# Patient Record
Sex: Male | Born: 1985 | Race: Black or African American | Hispanic: No | Marital: Single
Health system: Midwestern US, Community
[De-identification: ages and names within clinical notes are randomized; demographics above are authoritative.]

## PROBLEM LIST (undated history)

## (undated) HISTORY — DX: Morbid (severe) obesity due to excess calories: E66.01

## (undated) HISTORY — PX: NO PAST SURGERIES: SHX2092

---

## 2015-02-09 ENCOUNTER — Ambulatory Visit
Admit: 2015-02-09 | Discharge: 2015-02-09 | Payer: BLUE CROSS/BLUE SHIELD | Attending: Family Medicine | Primary: Family Medicine

## 2015-02-09 ENCOUNTER — Ambulatory Visit: Attending: Family Medicine | Primary: Family Medicine

## 2015-02-09 DIAGNOSIS — Z0001 Encounter for general adult medical examination with abnormal findings: Secondary | ICD-10-CM

## 2015-02-09 DIAGNOSIS — Z6841 Body Mass Index (BMI) 40.0 and over, adult: Secondary | ICD-10-CM | POA: Insufficient documentation

## 2015-02-09 NOTE — Patient Instructions (Signed)
Assessment:  1. Encounter for general adult medical examination with abnormal findings    2. Pes planus of both feet    3. Dyslipidemia    4. BMI 40.0-44.9, adult [facility](HCC)      Preventive exam    Plan:   \\Patient will have separately identifiable exam note to discuss problems noted today during this physical exam so that we can address and allot the appropriate amount of time to consult on the medical problems evident if necessary based on lab results.  Discussed the patient's above normal BMI with him.  I have recommended the following interventions: dietary management education, guidance, and counseling .  The BMI follow up plan is as follows: see below   Specifics about exercise and weight loss are important.  Try and get 45 minutes to 60 minutes per day of exercise for weight loss to occur.  This doesn't all have to be at once, it can be broken up throughout the day and can be cumulative time! Also, try to really get 5 days a week of exercise, but 3 days minimum.   Other useful tips are to park far away from store entrances so that you have to walk further.  Take the stairs if 3 flights or less instead of the elevator.  Focus on staying on the outer aisles at the grocery store, and avoid the inside aisles.  Also, try not to buy things with a plastic package surrounding them, as that is often a sign that it probably isn't good for you (potatio chips, crackers, or andy sort, cereal candy etc.)  Remember if it is raw it is probably great for you- raw vegetables and fruit and grains that is.  OUr dietician will re-enforce this and more if you select her services.    i will set up PT for custom shoe inserts and advice about your shin splints and flat feet.     No orders of the defined types were placed in this encounter.    Follow-up Disposition:  Return we will call when labs are back.

## 2015-02-09 NOTE — Progress Notes (Signed)
Subjective:  Peter CharlesWillie D Baird is a 29 y.o. male presents today with medical problems and to obtain refills if necessary, and they are also meeting me as their new physician for the first time as their initial visit   for a preventive wellness exam.   See chart for updated SH, FH, PSH, PMH, CURRENT MEDICATIONS AND HABITS, and complete systems review.    Objective:  Blood pressure 134/76, pulse 70, height 6\' 1"  (1.854 m), weight 318 lb (144.2 kg).  Body mass index is 41.96 kg/(m^2).   General- pleasant, no distress  Psych- alert and oriented to person, place and time  Mood and affect are appropriate to the visit  Lymph/Neck: normal  HEENT: normal  LUNGS: bcta  HEART: rrr s mrg  ABD: wnl  GENT: wnl.  BONE/JOINT: normal  NEURO: normal  SKIN: normal    Assessment:  1. Encounter for general adult medical examination with abnormal findings    2. Pes planus of both feet    3. Dyslipidemia    4. BMI 40.0-44.9, adult [facility](HCC)      Preventive exam    Plan:   \\Patient will have separately identifiable exam note to discuss problems noted today during this physical exam so that we can address and allot the appropriate amount of time to consult on the medical problems evident if necessary based on lab results.  Discussed the patient's above normal BMI with him.  I have recommended the following interventions: dietary management education, guidance, and counseling .  The BMI follow up plan is as follows: see below   Specifics about exercise and weight loss are important.  Try and get 45 minutes to 60 minutes per day of exercise for weight loss to occur.  This doesn't all have to be at once, it can be broken up throughout the day and can be cumulative time! Also, try to really get 5 days a week of exercise, but 3 days minimum.   Other useful tips are to park far away from store entrances so that you have to walk further.  Take the stairs if 3 flights or less instead of the  elevator.  Focus on staying on the outer aisles at the grocery store, and avoid the inside aisles.  Also, try not to buy things with a plastic package surrounding them, as that is often a sign that it probably isn't good for you (potatio chips, crackers, or andy sort, cereal candy etc.)  Remember if it is raw it is probably great for you- raw vegetables and fruit and grains that is.  OUr dietician will re-enforce this and more if you select her services.    i will set up PT for custom shoe inserts and advice about your shin splints and flat feet.     Orders Placed This Encounter   ??? METABOLIC PANEL, COMPREHENSIVE   ??? CBC WITH AUTOMATED DIFF   ??? TSH 3RD GENERATION   ??? HEMOGLOBIN A1C W/O EAG   ??? LIPID CASCADE W/REFLEX TO LIPO (WITH GRAPH)   ??? REFERRAL TO PHYSICAL THERAPY     Follow-up Disposition:  Return we will call when labs are back.

## 2015-02-10 LAB — CBC WITH AUTOMATED DIFF
ABS. BASOPHILS: 0 10*3/uL (ref 0.0–0.2)
ABS. EOSINOPHILS: 0.3 10*3/uL (ref 0.0–0.4)
ABS. IMM. GRANS.: 0 10*3/uL (ref 0.0–0.1)
ABS. MONOCYTES: 0.4 10*3/uL (ref 0.1–0.9)
ABS. NEUTROPHILS: 1.8 10*3/uL (ref 1.4–7.0)
Abs Lymphocytes: 1.7 10*3/uL (ref 0.7–3.1)
BASOPHILS: 1 %
EOSINOPHILS: 6 %
HCT: 42.6 % (ref 37.5–51.0)
HGB: 13.8 g/dL (ref 12.6–17.7)
IMMATURE GRANULOCYTES: 0 %
Lymphocytes: 41 %
MCH: 26.9 pg (ref 26.6–33.0)
MCHC: 32.4 g/dL (ref 31.5–35.7)
MCV: 83 fL (ref 79–97)
MONOCYTES: 10 %
NEUTROPHILS: 42 %
PLATELET: 244 10*3/uL (ref 150–379)
RBC: 5.13 x10E6/uL (ref 4.14–5.80)
RDW: 14.6 % (ref 12.3–15.4)
WBC: 4.2 10*3/uL (ref 3.4–10.8)

## 2015-02-10 LAB — LIPOPROTEIN ANALYSIS, BY NMR
HDL-P (Total): 31 umol/L (ref >=30.5)
LDL size: 21.6 nm (ref >20.5)
LDL-P: 1016 nmol/L — ABNORMAL HIGH (ref <1000)
LP-IR SCORE: 30 (ref <=45)
NMR PDF Image: 0
Small LDL-P: 278 nmol/L (ref <=527)

## 2015-02-10 LAB — METABOLIC PANEL, COMPREHENSIVE
A-G Ratio: 1.8 (ref 1.1–2.5)
ALT (SGPT): 22 IU/L (ref 0–44)
AST (SGOT): 20 IU/L (ref 0–40)
Albumin: 4.8 g/dL (ref 3.5–5.5)
Alk. phosphatase: 65 IU/L (ref 39–117)
BUN/Creatinine ratio: 12 (ref 8–19)
BUN: 12 mg/dL (ref 6–20)
Bilirubin, total: 0.2 mg/dL (ref 0.0–1.2)
CO2: 25 mmol/L (ref 18–29)
Calcium: 9.4 mg/dL (ref 8.7–10.2)
Chloride: 102 mmol/L (ref 97–106)
Creatinine: 0.97 mg/dL (ref 0.76–1.27)
GFR est AA: 123 mL/min/{1.73_m2} (ref >59)
GFR est non-AA: 107 mL/min/{1.73_m2} (ref >59)
GLOBULIN, TOTAL: 2.7 g/dL (ref 1.5–4.5)
Glucose: 81 mg/dL (ref 65–99)
Potassium: 4.5 mmol/L (ref 3.5–5.2)
Protein, total: 7.5 g/dL (ref 6.0–8.5)
Sodium: 142 mmol/L (ref 136–144)

## 2015-02-10 LAB — LIPID CASCADE W/REFLEX TO LIPO (WITH GRAPH)
Cholesterol, total: 164 mg/dL (ref 100–199)
HDL Cholesterol: 45 mg/dL (ref >39)
LDL, calculated: 98 mg/dL (ref 0–99)
LDL/HDL Ratio: 2.2 ratio units (ref 0.0–3.6)
Non-HDL Cholesterol: 119 mg/dL (ref 0–129)
Triglyceride: 105 mg/dL (ref 0–149)

## 2015-02-10 LAB — HEMOGLOBIN A1C W/O EAG: Hemoglobin A1c: 5.7 % — ABNORMAL HIGH (ref 4.8–5.6)

## 2015-02-10 LAB — TSH 3RD GENERATION: TSH: 1.01 u[IU]/mL (ref 0.450–4.500)

## 2015-02-17 NOTE — Progress Notes (Signed)
LMOM 11.16.16 for pt to c/b and schedule appt to discuss labs.  Labs in black box

## 2015-02-18 ENCOUNTER — Encounter: Attending: Family Medicine | Primary: Family Medicine

## 2015-02-18 NOTE — Progress Notes (Signed)
Labs printed, appt for 11.17.16 was cxd. Pt will c/b to reschedule. Labs in black box.

## 2016-12-26 ENCOUNTER — Ambulatory Visit
Admission: EM | Admit: 2016-12-26 | Discharge: 2016-12-26 | Disposition: A | Discharge status: Home / Self Care | Payer: 59 | Attending: Emergency Medicine | Admitting: Emergency Medicine

## 2016-12-26 ENCOUNTER — Encounter: Payer: Self-pay | Admitting: Emergency Medicine

## 2016-12-26 DIAGNOSIS — S91219A Laceration without foreign body of unspecified toe(s) with damage to nail, initial encounter: Secondary | ICD-10-CM

## 2016-12-26 DIAGNOSIS — Z23 Encounter for immunization: Secondary | ICD-10-CM | POA: Diagnosis not present

## 2016-12-26 DIAGNOSIS — S91212A Laceration without foreign body of left great toe with damage to nail, initial encounter: Secondary | ICD-10-CM | POA: Diagnosis not present

## 2016-12-26 MED ORDER — MUPIROCIN 2 % EX OINT: 1.0000 "application " | TOPICAL_OINTMENT | Freq: Three times a day (TID) | CUTANEOUS | 0 refills | Status: DC

## 2016-12-26 MED ORDER — TETANUS-DIPHTH-ACELL PERTUSSIS 5-2.5-18.5 LF-MCG/0.5 IM SUSP
0.5000 mL | Freq: Once | INTRAMUSCULAR | Status: AC
  Administered 2016-12-26: 0.5 mL via INTRAMUSCULAR

## 2016-12-26 NOTE — ED Triage Notes (Signed)
Patient states that he cut his left 1st toe a week ago.  Patient states that he has had some swelling and discoloration in his toenail.

## 2016-12-26 NOTE — ED Provider Notes (Signed)
[facility]    CSN: [id number] Arrival date & time: 12/26/16  1827     History   Chief Complaint Chief Complaint  Patient presents with  . Extremity Laceration    HPI Kamar Callender is a 31 y.o. male.   HPI  This a 31 year old male who presents with a laceration over the base of his left great toenail that occurred about a week ago. He states that in his work boots he thinks that the steel toe became uncovered and actually lacerated the base of the toe nail. It swelled at first and was painful but with elevation it seemed to subside. He's been applying  Neosporin ointment to the area. They came in to see if anything else needs to be done. Has had no drainage from the area . He's had no swelling and no redness. He is not current on his tetanus toxoid.         History reviewed. No pertinent past medical history.  There are no active problems to display for this patient.   History reviewed. No pertinent surgical history.     Home Medications    Prior to Admission medications   Medication Sig Start Date End Date Taking? Authorizing Provider  mupirocin ointment (BACTROBAN) 2 % Apply 1 application topically 3 (three) times daily. 12/26/16   Lutricia Feil, PA-C    Family History Family History  Problem Relation Age of Onset  . Hypertension Mother   . Cancer Father     Social History Social History  Substance Use Topics  . Smoking status: Never Smoker  . Smokeless tobacco: Never Used  . Alcohol use No     Allergies   Patient has no known allergies.   Review of Systems Review of Systems  Constitutional: Positive for activity change. Negative for appetite change, chills, fatigue and fever.  Skin: Positive for color change and wound.  All other systems reviewed and are negative.    Physical Exam Triage Vital Signs ED Triage Vitals  Enc Vitals Group     BP 12/26/16 1911 135/66     Pulse Rate 12/26/16 1911 65     Resp 12/26/16 1911 17     Temp 12/26/16 1911 98.3 F (36.8 C)     Temp Source 12/26/16 1911 Oral     SpO2 12/26/16 1911 99 %     Weight 12/26/16 1908 (!) 310 lb (140.6 kg)     Height 12/26/16 1908  (1.854 m)     Head Circumference --      Peak Flow --      Pain Score 12/26/16 1908 0     Pain Loc --      Pain Edu? --      Excl. in GC? --    No data found.   Updated Vital Signs BP 135/66 (BP Location: Left Arm)   Pulse 65   Temp 98.3 F (36.8 C) (Oral)   Resp 17   Ht  (1.854 m)   Wt (!) 310 lb (140.6 kg)   SpO2 99%   BMI 40.90 kg/m   Visual Acuity Right Eye Distance:   Left Eye Distance:   Bilateral Distance:    Right Eye Near:   Left Eye Near:    Bilateral Near:     Physical Exam  Constitutional: He is oriented to person, place, and time. He appears well-developed and well-nourished. No distress.  HENT:  Head: Normocephalic.  Eyes: Pupils are equal, round, and reactive  to light.  Neck: Normal range of motion.  Musculoskeletal: Normal range of motion. He exhibits no edema, tenderness or deformity.  Examination of the left great toe shows a mild separation of the base of the nail from the matrix. There is a partial avulsion. There is no swelling no warmth drainage or tenderness present. He has no subungual hematoma.  Neurological: He is alert and oriented to person, place, and time.  Skin: Skin is warm and dry. He is not diaphoretic.  Psychiatric: He has a normal mood and affect. His behavior is normal. Judgment and thought content normal.  Nursing note and vitals reviewed.    UC Treatments / Results  Labs (all labs ordered are listed, but only abnormal results are displayed) Labs Reviewed - No data to display  EKG  EKG Interpretation None       Radiology No results found.  Procedures Procedures (including critical care time)  Medications Ordered in UC Medications  Tdap (BOOSTRIX) injection 0.5 mL (0.5 mLs Intramuscular Given 12/26/16 1914)     Initial  Impression / Assessment and Plan / UC Course  I have reviewed the triage vital signs and the nursing notes.  Pertinent labs & imaging results that were available during my care of the patient were reviewed by me and considered in my medical decision making (see chart for details).     Plan: 1. Test/x-ray results and diagnosis reviewed with patient 2. rx as per orders; risks, benefits, potential side effects reviewed with patient 3. Recommend supportive treatment with the use of Bactroban ointment at the base of the nail. Using a dry sterile dressing. Told him there is a likelihood that he may lose the nail will regrow back in all probability. If there is any worsening symptoms he should follow-up with podiatrist. I've given him the name and number of Dr. Ether Griffins. 4. F/u prn if symptoms worsen or don't improve   Final Clinical Impressions(s) / UC Diagnoses   Final diagnoses:  Laceration of nail bed of toe, initial encounter    New Prescriptions Discharge Medication List as of 12/26/2016  7:42 PM    START taking these medications   Details  mupirocin ointment (BACTROBAN) 2 % Apply 1 application topically 3 (three) times daily., Starting Tue 12/26/2016, Normal         Controlled Substance Prescriptions [place] Controlled Substance Registry consulted? Not Applicable   Lutricia Feil, PA-C 12/26/16 1950

## 2017-06-14 ENCOUNTER — Encounter: Payer: Self-pay | Admitting: Nurse Practitioner

## 2017-06-14 ENCOUNTER — Ambulatory Visit (INDEPENDENT_AMBULATORY_CARE_PROVIDER_SITE_OTHER): Payer: 59 | Admitting: Nurse Practitioner

## 2017-06-14 VITALS — BP 134/69 | HR 64 | Temp 98.8°F | Resp 16 | Ht 73.0 in | Wt 313.0 lb

## 2017-06-14 DIAGNOSIS — Z7689 Persons encountering health services in other specified circumstances: Secondary | ICD-10-CM | POA: Diagnosis not present

## 2017-06-14 DIAGNOSIS — R0789 Other chest pain: Secondary | ICD-10-CM | POA: Diagnosis not present

## 2017-06-14 NOTE — Patient Instructions (Addendum)
Jamie Ortiz, Thank you for coming in to clinic today.  1. Continue to monitor chest pain symptoms   Work to identify any possible triggers for the symptoms. Lots of things can cause these symptoms.  If they return, we can always consider EKG or other workup when the pain is happening.  Please schedule a follow-up appointment with Jamie Ortiz, AGNP. Return in about 4 weeks (around 07/12/2017) for annual physical.  If you have any other questions or concerns, please feel free to call the clinic or send a message through MyChart. You may also schedule an earlier appointment if necessary.  You will receive a survey after today's visit either digitally by e-mail or paper by [employer] mail. Your experiences and feedback matter to us.  Please respond so we know how we are doing as we provide care for you.   Jamie McardleLauren Grey Rakestraw, DNP, AGNP-BC Adult Gerontology Nurse Practitioner [facility]  Phone: [phone] Hours: 8a-4p

## 2017-06-14 NOTE — Progress Notes (Signed)
Subjective:    Patient ID: Jamie Ortiz, male    DOB: 1985-12-26, 32 y.o.   MRN: [id number]  Jamie Ortiz is a 32 y.o. male presenting on 06/14/2017 for Establish Care   HPI Establish Care New Provider Pt last seen by PCP 2017 - in [place].  Obtain records from Dr. Lance Coon.   Intermittent Chest pains Occasionally has chest pain, and some in area of his heart.  2006-2007 had chest pains and had no results.  Wore a holter monitor.  Reduce sugar intake was only recommendation at that time. - Had heart murmur which resolved. - Periods of several months without symptoms and then will have 2-3 episodes and it will resolve. - Pain not changed with activity.   - No association to anxiety /stress - Last episode was about 2 weeks ago and none since that time. - No worsening or change since 07-06  History reviewed. No pertinent past medical history. History reviewed. No pertinent surgical history. Social History   Socioeconomic History  . Marital status: Single    Spouse name: Not on file  . Number of children: Not on file  . Years of education: Not on file  . Highest education level: High school graduate  Occupational History  . Not on file  Social Needs  . Financial resource strain: Not on file  . Food insecurity:    Worry: Not on file    Inability: Not on file  . Transportation needs:    Medical: Not on file    Non-medical: Not on file  Tobacco Use  . Smoking status: Never Smoker  . Smokeless tobacco: Never Used  Substance and Sexual Activity  . Alcohol use: No  . Drug use: No  . Sexual activity: Yes    Birth control/protection: None  Lifestyle  . Physical activity:    Days per week: Not on file    Minutes per session: Not on file  . Stress: Not on file  Relationships  . Social connections:    Talks on phone: Not on file    Gets together: Not on file    Attends religious service: Not on file    Active member of club or organization: Not on file    Attends  meetings of clubs or organizations: Not on file    Relationship status: Not on file  . Intimate partner violence:    Fear of current or ex partner: Not on file    Emotionally abused: Not on file    Physically abused: Not on file    Forced sexual activity: Not on file  Other Topics Concern  . Not on file  Social History Narrative  . Not on file   Family History  Problem Relation Age of Onset  . Hypertension Mother   . Cancer Father        lung, prostate  . Healthy Sister    Current Outpatient Medications on File Prior to Visit  Medication Sig  . mupirocin ointment (BACTROBAN) 2 % Apply 1 application topically 3 (three) times daily. (Patient not taking: Reported on 06/14/2017)   No current facility-administered medications on file prior to visit.     Review of Systems  Constitutional: Negative.   HENT: Negative.   Eyes: Negative.   Respiratory: Positive for chest tightness.   Cardiovascular: Negative.   Gastrointestinal: Negative.   Endocrine: Negative.   Genitourinary: Negative.   Musculoskeletal: Negative.   Skin: Negative.   Allergic/Immunologic: Negative.   Neurological: Negative.  Hematological: Negative.   Psychiatric/Behavioral: Negative.    Per HPI unless specifically indicated above     Objective:    BP 134/69   Pulse 64   Temp 98.8 F (37.1 C) (Oral)   Resp 16   Ht 6\' 1"  (1.854 m)   Wt (!) 313 lb (142 kg)   BMI 41.30 kg/m   Wt Readings from Last 3 Encounters:  06/14/17 (!) 313 lb (142 kg)  12/26/16 (!) 310 lb (140.6 kg)    Physical Exam  Constitutional: He is oriented to person, place, and time. He appears well-developed and well-nourished. No distress.  HENT:  Head: Normocephalic and atraumatic.  Right Ear: External ear normal.  Left Ear: External ear normal.  Nose: Nose normal.  Mouth/Throat: Oropharynx is clear and moist.  Eyes: Pupils are equal, round, and reactive to light. Conjunctivae are normal.  Neck: Normal range of motion. Neck  supple. No JVD present. No tracheal deviation present. No thyromegaly present.  Cardiovascular: Normal rate, regular rhythm, normal heart sounds and intact distal pulses. Exam reveals no gallop and no friction rub.  No murmur heard. Pulmonary/Chest: Effort normal and breath sounds normal. No respiratory distress.  Abdominal: Soft. Bowel sounds are normal. He exhibits no distension. There is no tenderness.  Musculoskeletal: Normal range of motion.  No reproducible chest wall tenderness  Lymphadenopathy:    He has no cervical adenopathy.  Neurological: He is alert and oriented to person, place, and time. No cranial nerve deficit.  Skin: Skin is warm and dry.  Psychiatric: He has a normal mood and affect. His behavior is normal. Judgment and thought content normal.  Nursing note and vitals reviewed.    No results found for this or any previous visit.    Assessment & Plan:   Problem List Items Addressed This Visit    None    Visit Diagnoses    Encounter to establish care    -  Primary Previous PCP many years ago.  Records will not be requested.  Past medical, family, and surgical history reviewed w/ pt. patient desires annual physical.  Return 4 weeks for annual physical.     Chest wall discomfort Intermittent episodes of chest wall discomfort.  Prior workup for cardiac cause was negative in 2006-2007.  Current symptoms recurred 2 weeks ago were very similar to symptoms at that time.  No recurrence in the last 2 weeks.  Differential diagnosis includes: Anxiety with panic attack, chest wall muscle strain, SVT, MI, PE.  Unlikely that patient experienced MI or PE given negative symptoms over the last 2 weeks.  Plan: 1.  Monitor symptoms and return sooner if symptoms recur.  Consider EKG at that time.  Patient agrees to defer EKG today. 2.  Follow-up with recurrence of symptoms.  Labs deferred to annual physical by patient today.       Follow up plan: Return in about 4 weeks (around  07/12/2017) for annual physical.  Wilhelmina McardleLauren Vidal Lampkins, DNP, AGPCNP-BC Adult Gerontology Primary Care Nurse Practitioner [facility] 06/30/2017, 4:16 PM

## 2017-06-30 ENCOUNTER — Encounter: Payer: Self-pay | Admitting: Nurse Practitioner

## 2017-07-17 ENCOUNTER — Other Ambulatory Visit: Payer: Self-pay

## 2017-07-17 ENCOUNTER — Encounter: Payer: Self-pay | Admitting: Nurse Practitioner

## 2017-07-17 ENCOUNTER — Ambulatory Visit (INDEPENDENT_AMBULATORY_CARE_PROVIDER_SITE_OTHER): Payer: 59 | Admitting: Nurse Practitioner

## 2017-07-17 VITALS — BP 127/58 | HR 73 | Temp 98.4°F | Ht 73.0 in | Wt 318.0 lb

## 2017-07-17 DIAGNOSIS — Z0000 Encounter for general adult medical examination without abnormal findings: Secondary | ICD-10-CM | POA: Diagnosis not present

## 2017-07-17 NOTE — Progress Notes (Signed)
Subjective:    Patient ID: Jamie Ortiz, male    DOB: 18-Mar-1986, 32 y.o.   MRN: [id number]  Roby Donaway is a 32 y.o. male presenting on 07/17/2017 for Annual Exam and Sinus Problem (post nasal drainage x 2 hrs )   HPI Annual Physical Exam Patient has been feeling well.  They have acute concerns today about sinus congestion and low back pain. Sleeps 5-6, occasionally 7 hours per night uninterrupted. - Back pain started about 1.5 weeks ago.  Not sure what cause was or if it was related to stomach sleeping.  He is transitioning away from stomach sleeping to improve symptoms. Very minimal improvement with ibuprofen qod.  Is doing some lower back stretches with temporary relief.   HEALTH MAINTENANCE: Weight/BMI: increased 5 lbs in 4 weeks Physical activity: Resumed exercise routine 4-5 days. Diet: Is reducing meals out. Seatbelt: always Sunscreen: usually  HIV: declines Optometry: about 3 years ago Dentistry: every 6 months  VACCINES: Tetanus: 12/2016   Sinus congestion Pt has noted post nasal drip x last 2 hours.  Is not yet taking allergy medication and is planning to start today.  No additional concerns regarding this for today's visit.  No past medical history on file. No past surgical history on file. Social History   Socioeconomic History  . Marital status: Single    Spouse name: Not on file  . Number of children: Not on file  . Years of education: Not on file  . Highest education level: High school graduate  Occupational History  . Not on file  Social Needs  . Financial resource strain: Not on file  . Food insecurity:    Worry: Not on file    Inability: Not on file  . Transportation needs:    Medical: Not on file    Non-medical: Not on file  Tobacco Use  . Smoking status: Never Smoker  . Smokeless tobacco: Never Used  Substance and Sexual Activity  . Alcohol use: No  . Drug use: No  . Sexual activity: Yes    Birth control/protection: None  Lifestyle  .  Physical activity:    Days per week: Not on file    Minutes per session: Not on file  . Stress: Not on file  Relationships  . Social connections:    Talks on phone: Not on file    Gets together: Not on file    Attends religious service: Not on file    Active member of club or organization: Not on file    Attends meetings of clubs or organizations: Not on file    Relationship status: Not on file  . Intimate partner violence:    Fear of current or ex partner: Not on file    Emotionally abused: Not on file    Physically abused: Not on file    Forced sexual activity: Not on file  Other Topics Concern  . Not on file  Social History Narrative  . Not on file   Family History  Problem Relation Age of Onset  . Hypertension Mother   . Cancer Father        lung, prostate  . Healthy Sister    Current Outpatient Medications on File Prior to Visit  Medication Sig  . mupirocin ointment (BACTROBAN) 2 % Apply 1 application topically 3 (three) times daily. (Patient not taking: Reported on 06/14/2017)   No current facility-administered medications on file prior to visit.     Review of Systems  Constitutional: Negative.  HENT: Positive for congestion and sinus pressure.   Eyes: Negative.   Respiratory: Negative.   Cardiovascular: Negative.   Gastrointestinal: Negative.   Endocrine: Negative.   Genitourinary: Negative.   Musculoskeletal: Positive for arthralgias and back pain.  Skin: Negative.   Allergic/Immunologic: Negative.   Neurological: Negative.   Hematological: Negative.   Psychiatric/Behavioral: Negative.    Per HPI unless specifically indicated above     Objective:    BP (!) 127/58 (BP Location: Right Arm, Patient Position: Sitting, Cuff Size: Large)   Pulse 73   Temp 98.4 F (36.9 C) (Oral)   Ht 6' 1"  (1.854 m)   Wt (!) 318 lb (144.2 kg)   BMI 41.96 kg/m   Wt Readings from Last 3 Encounters:  07/17/17 (!) 318 lb (144.2 kg)  06/14/17 (!) 313 lb (142 kg)    12/26/16 (!) 310 lb (140.6 kg)    Physical Exam  Constitutional: He is oriented to person, place, and time. He appears well-developed and well-nourished. No distress.  HENT:  Head: Normocephalic and atraumatic.  Right Ear: External ear normal.  Left Ear: External ear normal.  Nose: Nose normal.  Mouth/Throat: Oropharynx is clear and moist.  Eyes: Pupils are equal, round, and reactive to light. Conjunctivae are normal.  Neck: Normal range of motion. Neck supple. No JVD present. No tracheal deviation present. No thyromegaly present.  Cardiovascular: Normal rate, regular rhythm, normal heart sounds and intact distal pulses. Exam reveals no gallop and no friction rub.  No murmur heard. Pulmonary/Chest: Effort normal and breath sounds normal. No respiratory distress.  Abdominal: Soft. Bowel sounds are normal. He exhibits no distension. There is no tenderness.  Musculoskeletal: Normal range of motion.  Lymphadenopathy:    He has no cervical adenopathy.  Neurological: He is alert and oriented to person, place, and time. No cranial nerve deficit.  Skin: Skin is warm and dry.  Psychiatric: He has a normal mood and affect. His behavior is normal. Judgment and thought content normal.  Nursing note and vitals reviewed.      Assessment & Plan:   Problem List Items Addressed This Visit    None    Visit Diagnoses    Encounter for annual physical exam    -  Primary   Relevant Orders   Hemoglobin A1c   Lipid panel   TSH   CBC with Differential/Platelet   CMP14+EGFR    Physical exam with no new findings.  Well adult with acute concerns regarding acute low back pain that is beginning to resolve with stretching.  Plan: 1. Obtain health maintenance screenings with labs as ordered. 2. Provided low back pain exercises.  Continue ibuprofen as currently. 3. Return 1 year for annual physical.     Follow up plan: Return in about 1 year (around 07/18/2018) for annual physical.  Cassell Smiles,  DNP, AGPCNP-BC Adult Gerontology Primary Care Nurse Practitioner [facility] 07/17/2017, 3:29 PM

## 2017-07-17 NOTE — Patient Instructions (Addendum)
Jamie Ortiz,   Thank you for coming in to clinic today.  1. Your provider would like to you have your annual eye exam. Please contact your current eye doctor or here are some good options for you to contact.   [facility]   Address: [address], [place], [place] [address] Phone: [phone]  Website: [email or web address]   [facility] [address], [place], [place] [address] Phone: [phone] [email or web address]  [facility]  Address: [address], [place], [place] [address] Phone: [phone]   [facility] [address], [place] [address] Phone: [phone]  [facility] Address: [address], [place], [place] [address]  Phone: [phone]   2. Dentist Offices: [facility] Address: [address], [place], [place] [address]  Phone: [phone]  Leander RamsKaren Barwick, DDS Address: [address], [place], [place] [address]  Phone: [phone]   3. Sinus congestion START taking loratadine (Claritin) or cetirizine (Zyrtec) 10 mg once daily for the next 4-6 weeks to prevent mucous production. May also start Flonase 1-2 sprays in each nostril once daily for 4-6 weeks.  Please schedule a follow-up appointment with Wilhelmina McardleLauren Kasiyah Platter, AGNP. Return in about 1 year (around 07/18/2018) for annual physical.  If you have any other questions or concerns, please feel free to call the clinic or send a message through MyChart. You may also schedule an earlier appointment if necessary.  You will receive a survey after today's visit either digitally by e-mail or paper by [employer] mail. Your experiences and feedback matter to us.  Please respond so we know how we are doing as we provide care for you.   Wilhelmina McardleLauren Cedrik Heindl, DNP, AGNP-BC Adult Gerontology Nurse Practitioner [facility] [place] [facility], [facility]  Low Back Pain Exercises See other page with pictures of each exercise.  Start with 1 or 2 of these exercises that you are most  comfortable with. Do not do any exercises that cause you significant worsening pain. Some of these may cause some "stretching soreness" but it should go away after you stop the exercise, and get better over time. Gradually increase up to 3-4 exercises as tolerated.  Standing hamstring stretch: Place the heel of your leg on a stool about 15 inches high. Keep your knee straight. Lean forward, bending at the hips until you feel a mild stretch in the back of your thigh. Make sure you do not roll your shoulders and bend at the waist when doing this or you will stretch your lower back instead. Hold the stretch for 15 to 30 seconds. Repeat 3 times. Repeat the same stretch on your other leg.  Cat and camel: Get down on your hands and knees. Let your stomach sag, allowing your back to curve downward. Hold this position for 5 seconds. Then arch your back and hold for 5 seconds. Do 3 sets of 10.  Quadriped Arm/Leg Raises: Get down on your hands and knees. Tighten your abdominal muscles to stiffen your spine. While keeping your abdominals tight, raise one arm and the opposite leg away from you. Hold this position for 5 seconds. Lower your arm and leg slowly and alternate sides. Do this 10 times on each side.  Pelvic tilt: Lie on your back with your knees bent and your feet flat on the floor. Tighten your abdominal muscles and push your lower back into the floor. Hold this position for 5 seconds, then relax. Do 3 sets of 10.  Partial curl: Lie on your back with your knees bent and your feet flat on the floor. Tighten your stomach muscles and flatten your back against the floor. Tuck your chin to your chest. With your hands stretched out in front of you, curl your upper body forward until your shoulders clear the floor. Hold this position for 3 seconds. Don't hold your breath. It helps to breathe out as you lift your shoulders up. Relax. Repeat 10 times. Build to 3 sets of 10. To challenge yourself, clasp your hands  behind your head and keep your elbows out to the side.  Lower trunk rotation: Lie on your back with your knees bent and your feet flat on the floor. Tighten your abdominal muscles and push your lower back into the floor. Keeping your shoulders down flat, gently rotate your legs to one side, then the other as far as you can. Repeat 10 to 20 times.  Single knee to chest stretch: Lie on your back with your legs straight out in front of you. Bring one knee up to your chest and grasp the back of your thigh. Pull your knee toward your chest, stretching your buttock muscle. Hold this position for 15 to 30 seconds and return to the starting position. Repeat 3 times on each side.  Double knee to chest: Lie on your back with your knees bent and your feet flat on the floor. Tighten your abdominal muscles and push your lower back into the floor. Pull both knees up to your chest. Hold for 5 seconds and repeat 10 to 20 times.

## 2017-07-24 ENCOUNTER — Other Ambulatory Visit: Payer: Self-pay | Admitting: Nurse Practitioner

## 2017-07-24 ENCOUNTER — Other Ambulatory Visit: Payer: Self-pay

## 2017-07-24 DIAGNOSIS — Z0000 Encounter for general adult medical examination without abnormal findings: Secondary | ICD-10-CM | POA: Diagnosis not present

## 2017-07-25 LAB — CBC WITH DIFFERENTIAL/PLATELET
Basophils Absolute: 0 10*3/uL (ref 0.0–0.2)
Basos: 1 %
EOS (ABSOLUTE): 0.1 10*3/uL (ref 0.0–0.4)
Eos: 4 %
Hematocrit: 41.1 % (ref 37.5–51.0)
Hemoglobin: 13.9 g/dL (ref 13.0–17.7)
Immature Grans (Abs): 0 10*3/uL (ref 0.0–0.1)
Immature Granulocytes: 0 %
Lymphocytes Absolute: 1.6 10*3/uL (ref 0.7–3.1)
Lymphs: 43 %
MCH: 27.8 pg (ref 26.6–33.0)
MCHC: 33.8 g/dL (ref 31.5–35.7)
MCV: 82 fL (ref 79–97)
Monocytes Absolute: 0.3 10*3/uL (ref 0.1–0.9)
Monocytes: 8 %
Neutrophils Absolute: 1.6 10*3/uL (ref 1.4–7.0)
Neutrophils: 44 %
Platelets: 257 10*3/uL (ref 150–379)
RBC: 5 x10E6/uL (ref 4.14–5.80)
RDW: 14.3 % (ref 12.3–15.4)
WBC: 3.7 10*3/uL (ref 3.4–10.8)

## 2017-07-25 LAB — LIPID PANEL W/O CHOL/HDL RATIO
Cholesterol, Total: 189 mg/dL (ref 100–199)
HDL: 52 mg/dL (ref >39)
LDL Calculated: 122 mg/dL — ABNORMAL HIGH (ref 0–99)
Triglycerides: 73 mg/dL (ref 0–149)
VLDL Cholesterol Cal: 15 mg/dL (ref 5–40)

## 2017-07-25 LAB — HGB A1C W/O EAG: Hgb A1c MFr Bld: 5.8 % — ABNORMAL HIGH (ref 4.8–5.6)

## 2017-07-25 LAB — TSH: TSH: 0.577 u[IU]/mL (ref 0.450–4.500)

## 2018-02-19 ENCOUNTER — Encounter: Payer: Self-pay | Admitting: Nurse Practitioner

## 2018-02-19 ENCOUNTER — Ambulatory Visit (INDEPENDENT_AMBULATORY_CARE_PROVIDER_SITE_OTHER): Payer: 59 | Admitting: Nurse Practitioner

## 2018-02-19 ENCOUNTER — Other Ambulatory Visit: Payer: Self-pay

## 2018-02-19 VITALS — BP 129/62 | HR 69 | Temp 98.7°F | Ht 73.0 in | Wt 326.4 lb

## 2018-02-19 DIAGNOSIS — M79641 Pain in right hand: Secondary | ICD-10-CM | POA: Diagnosis not present

## 2018-02-19 NOTE — Progress Notes (Signed)
Subjective:    Patient ID: Jamie Ortiz, male    DOB: 05/14/1985, 32 y.o.   MRN: [id number]  Jamie Ortiz is a 32 y.o. male presenting on 02/19/2018 for Hand Pain (chronic intermittent bilateral palm hand pain that worsen with pressure. Pt states when he's in a plank position the pain intensify and make him have to change position.  )   HPI RIGHT hand pain today.  Has had in both hands in past. Hand pain for about 5 years intermittently.  Alternates between both hands.  Pain last experienced 2 days ago when pushing a cart with upright cart and vertical positioning of about 80-90 lbs. With throbbing pain  2 weeks prior was in plank position with throbbing pain in palm. Pinched, sharp pain remains for 15-30 minutes with spontaneous resolution.  Would return same day if doing same activity. - Had been 2-3 months prior since pain. - No shooting pain.   Social History   Tobacco Use  . Smoking status: Never Smoker  . Smokeless tobacco: Never Used  Substance Use Topics  . Alcohol use: No  . Drug use: No    Review of Systems Per HPI unless specifically indicated above     Objective:    BP 129/62 (BP Location: Left Arm, Patient Position: Sitting, Cuff Size: Large)   Pulse 69   Temp 98.7 F (37.1 C) (Oral)   Ht 6\' 1"  (1.854 m)   Wt (!) 326 lb 6.4 oz (148.1 kg)   BMI 43.06 kg/m   Wt Readings from Last 3 Encounters:  02/19/18 (!) 326 lb 6.4 oz (148.1 kg)  07/17/17 (!) 318 lb (144.2 kg)  06/14/17 (!) 313 lb (142 kg)    Physical Exam  Constitutional: He is oriented to person, place, and time. He appears well-developed and well-nourished. No distress.  HENT:  Head: Normocephalic and atraumatic.  Neck: Normal range of motion.  Cardiovascular: Normal rate, regular rhythm, S1 normal, S2 normal, normal heart sounds and intact distal pulses.  Pulmonary/Chest: Effort normal and breath sounds normal. No respiratory distress.  Musculoskeletal:       Right wrist: Normal.       Left wrist:  Normal.       Right hand: He exhibits tenderness (thenar prominence). He exhibits normal range of motion, no bony tenderness and normal capillary refill. Normal sensation noted. Normal strength noted.       Left hand: Normal. He exhibits normal range of motion and no tenderness. Normal sensation noted. Normal strength noted.  Neurological: He is alert and oriented to person, place, and time.  Skin: Skin is warm and dry. Capillary refill takes less than 2 seconds.  Psychiatric: He has a normal mood and affect. His behavior is normal. Judgment and thought content normal.  Vitals reviewed.  Results for orders placed or performed in visit on 07/24/17  CBC with Differential/Platelet  Result Value Ref Range   WBC 3.7 3.4 - 10.8 x10E3/uL   RBC 5.00 4.14 - 5.80 x10E6/uL   Hemoglobin 13.9 13.0 - 17.7 g/dL   Hematocrit 40.941.1 81.137.5 - 51.0 %   MCV 82 79 - 97 fL   MCH 27.8 26.6 - 33.0 pg   MCHC 33.8 31.5 - 35.7 g/dL   RDW 91.414.3 78.212.3 - 95.615.4 %   Platelets 257 150 - 379 x10E3/uL   Neutrophils 44 Not Estab. %   Lymphs 43 Not Estab. %   Monocytes 8 Not Estab. %   Eos 4 Not Estab. %  Basos 1 Not Estab. %   Neutrophils Absolute 1.6 1.4 - 7.0 x10E3/uL   Lymphocytes Absolute 1.6 0.7 - 3.1 x10E3/uL   Monocytes Absolute 0.3 0.1 - 0.9 x10E3/uL   EOS (ABSOLUTE) 0.1 0.0 - 0.4 x10E3/uL   Basophils Absolute 0.0 0.0 - 0.2 x10E3/uL   Immature Granulocytes 0 Not Estab. %   Immature Grans (Abs) 0.0 0.0 - 0.1 x10E3/uL  Lipid Panel w/o Chol/HDL Ratio  Result Value Ref Range   Cholesterol, Total 189 100 - 199 mg/dL   Triglycerides 73 0 - 149 mg/dL   HDL 52 >16 mg/dL   VLDL Cholesterol Cal 15 5 - 40 mg/dL   LDL Calculated 109 (H) 0 - 99 mg/dL  Hgb U0A w/o eAG  Result Value Ref Range   Hgb A1c MFr Bld 5.8 (H) 4.8 - 5.6 %  TSH  Result Value Ref Range   TSH 0.577 0.450 - 4.500 uIU/mL      Assessment & Plan:   Problem List Items Addressed This Visit    None    Visit Diagnoses    Hand pain, right    -   Primary    Likely is thenar muscle strain with pain likely self-limited.  Muscle strain possible complicated by repetitive movements at job and with exercise.  Plan:  1. Treat with OTC pain meds (acetaminophen and ibuprofen OR aleeve).  Discussed alternate dosing and max dosing. - Take Aleeve 220-440 mg bid x 14 days then prn. 2. Apply heat and/or ice to affected area. 3. May also apply a muscle rub with lidocaine or lidocaine patch after heat or ice. 4. Recommended patient consider orthopedics or OT referrals.  Declined today. 5. Follow up prn 2-3 weeks if no improvement.    Follow up plan: Return 2-3 weeks if symptoms worsen or fail to improve.  Wilhelmina Mcardle, DNP, AGPCNP-BC Adult Gerontology Primary Care Nurse Practitioner [facility] 02/19/2018, 3:58 PM

## 2018-02-19 NOTE — Patient Instructions (Addendum)
Jamie Ortiz,   Thank you for coming in to clinic today.  1. You have a thenar muscle strain (muscle inside your hand at your thumb).  - Take Aleeve 220 mg twice daily for 14 days to help reduce swelling, inflammation, irritation.  Then take 220-440 mg as needed.  - Can alternate Aleeve OR ibuprofen with Tylenol in the same day. - Use heat and ice.  Apply this for 15 minutes at a time 6-8 times per day.   - Muscle rub with lidocaine, lidocaine patch, Biofreeze, or tiger balm for topical pain relief.  Avoid using this with heat and ice to avoid burns.  2. Can consider future occupational therapy to find any movement that increase risk of injury.  3. Can consider future orthopedic evaluation (specialists).  Please schedule a follow-up appointment with Jamie Ortiz, AGNP. Return 2-3 weeks if symptoms worsen or fail to improve.  If you have any other questions or concerns, please feel free to call the clinic or send a message through MyChart. You may also schedule an earlier appointment if necessary.  You will receive a survey after today's visit either digitally by e-mail or paper by [employer] mail. Your experiences and feedback matter to us.  Please respond so we know how we are doing as we provide care for you.   Jamie McardleLauren Aramis Weil, DNP, AGNP-BC Adult Gerontology Nurse Practitioner [facility] [place] [facility], [facility]

## 2018-02-22 ENCOUNTER — Encounter: Payer: Self-pay | Admitting: Nurse Practitioner

## 2018-05-24 ENCOUNTER — Encounter: Payer: Self-pay | Admitting: Nurse Practitioner

## 2018-05-24 ENCOUNTER — Ambulatory Visit (INDEPENDENT_AMBULATORY_CARE_PROVIDER_SITE_OTHER): Payer: 59 | Admitting: Nurse Practitioner

## 2018-05-24 ENCOUNTER — Other Ambulatory Visit: Payer: Self-pay

## 2018-05-24 VITALS — BP 135/70 | HR 70 | Temp 99.1°F | Ht 73.0 in | Wt 325.8 lb

## 2018-05-24 DIAGNOSIS — Z0000 Encounter for general adult medical examination without abnormal findings: Secondary | ICD-10-CM | POA: Diagnosis not present

## 2018-05-24 DIAGNOSIS — Z114 Encounter for screening for human immunodeficiency virus [HIV]: Secondary | ICD-10-CM

## 2018-05-24 DIAGNOSIS — Z131 Encounter for screening for diabetes mellitus: Secondary | ICD-10-CM

## 2018-05-24 NOTE — Progress Notes (Signed)
Subjective:    Patient ID: Jamie Ortiz, male    DOB: 06-10-1985, 33 y.o.   MRN: [id number]  Jamie Ortiz is a 33 y.o. male presenting on 05/24/2018 for Annual Exam   HPI Annual Physical Exam Patient has been feeling well.  They have no acute concerns today.  Sleeps 6-8 hours per night interrupted occasionally  HEALTH MAINTENANCE: Weight/BMI: morbid obesity - Patient goal 215  Physical activity: patient is exercising 3 days per week. Diet: has resumed a healthier eating plan Seatbelt: always Sunscreen: regularly HIV: low risk, but not screened recently Optometry: had one a couple months ago Dentistry: regularly  VACCINES: Tetanus: 2018 Influenza: declined  Social History   Tobacco Use  . Smoking status: Never Smoker  . Smokeless tobacco: Never Used  Substance Use Topics  . Alcohol use: No  . Drug use: No    Review of Systems  Constitutional: Negative for activity change, appetite change, fatigue and unexpected weight change.  HENT: Negative for congestion, hearing loss and trouble swallowing.   Eyes: Negative for visual disturbance.  Respiratory: Negative for choking, shortness of breath and wheezing.   Cardiovascular: Negative for chest pain and palpitations.  Gastrointestinal: Negative for abdominal pain, blood in stool, constipation and diarrhea.  Genitourinary: Negative for difficulty urinating, discharge, flank pain, genital sores, penile pain, penile swelling, scrotal swelling and testicular pain.  Musculoskeletal: Negative for arthralgias, back pain and myalgias.  Skin: Negative for color change, rash and wound.  Allergic/Immunologic: Negative for environmental allergies.  Neurological: Negative for dizziness, seizures, weakness and headaches.  Psychiatric/Behavioral: Negative for behavioral problems, decreased concentration, dysphoric mood, sleep disturbance and suicidal ideas. The patient is not nervous/anxious.    Per HPI unless specifically indicated  above     Objective:    BP 135/70 (BP Location: Left Arm, Patient Position: Sitting, Cuff Size: Large)   Pulse 70   Temp 99.1 F (37.3 C) (Oral)   Ht 6\' 1"  (1.854 m)   Wt (!) 325 lb 12.8 oz (147.8 kg)   BMI 42.98 kg/m   Wt Readings from Last 3 Encounters:  05/24/18 (!) 325 lb 12.8 oz (147.8 kg)  02/19/18 (!) 326 lb 6.4 oz (148.1 kg)  07/17/17 (!) 318 lb (144.2 kg)    Physical Exam Vitals signs and nursing note reviewed.  Constitutional:      General: He is not in acute distress.    Appearance: Normal appearance. He is well-developed. He is morbidly obese.  HENT:     Head: Normocephalic and atraumatic.     Right Ear: Tympanic membrane, ear canal and external ear normal.     Left Ear: Tympanic membrane, ear canal and external ear normal.     Nose: Nose normal.     Mouth/Throat:     Mouth: Mucous membranes are moist.     Pharynx: Oropharynx is clear.  Eyes:     Conjunctiva/sclera: Conjunctivae normal.     Pupils: Pupils are equal, round, and reactive to light.  Neck:     Musculoskeletal: Normal range of motion and neck supple.     Thyroid: No thyromegaly.     Vascular: No JVD.     Trachea: No tracheal deviation.  Cardiovascular:     Rate and Rhythm: Normal rate and regular rhythm.     Heart sounds: Normal heart sounds. No murmur. No friction rub. No gallop.   Pulmonary:     Effort: Pulmonary effort is normal. No respiratory distress.     Breath sounds: Normal breath  sounds.  Abdominal:     General: Bowel sounds are normal. There is no distension.     Palpations: Abdomen is soft. There is no mass.     Tenderness: There is no abdominal tenderness. There is no guarding or rebound.     Hernia: No hernia is present.  Musculoskeletal: Normal range of motion.  Lymphadenopathy:     Cervical: No cervical adenopathy.  Skin:    General: Skin is warm and dry.     Capillary Refill: Capillary refill takes less than 2 seconds.  Neurological:     General: No focal deficit  present.     Mental Status: He is alert and oriented to person, place, and time. Mental status is at baseline.     Cranial Nerves: No cranial nerve deficit.  Psychiatric:        Mood and Affect: Mood normal.        Behavior: Behavior normal.        Thought Content: Thought content normal.        Judgment: Judgment normal.     Results for orders placed or performed in visit on 07/24/17  CBC with Differential/Platelet  Result Value Ref Range   WBC 3.7 3.4 - 10.8 x10E3/uL   RBC 5.00 4.14 - 5.80 x10E6/uL   Hemoglobin 13.9 13.0 - 17.7 g/dL   Hematocrit 58.0 99.8 - 51.0 %   MCV 82 79 - 97 fL   MCH 27.8 26.6 - 33.0 pg   MCHC 33.8 31.5 - 35.7 g/dL   RDW 33.8 25.0 - 53.9 %   Platelets 257 150 - 379 x10E3/uL   Neutrophils 44 Not Estab. %   Lymphs 43 Not Estab. %   Monocytes 8 Not Estab. %   Eos 4 Not Estab. %   Basos 1 Not Estab. %   Neutrophils Absolute 1.6 1.4 - 7.0 x10E3/uL   Lymphocytes Absolute 1.6 0.7 - 3.1 x10E3/uL   Monocytes Absolute 0.3 0.1 - 0.9 x10E3/uL   EOS (ABSOLUTE) 0.1 0.0 - 0.4 x10E3/uL   Basophils Absolute 0.0 0.0 - 0.2 x10E3/uL   Immature Granulocytes 0 Not Estab. %   Immature Grans (Abs) 0.0 0.0 - 0.1 x10E3/uL  Lipid Panel w/o Chol/HDL Ratio  Result Value Ref Range   Cholesterol, Total 189 100 - 199 mg/dL   Triglycerides 73 0 - 149 mg/dL   HDL 52 >76 mg/dL   VLDL Cholesterol Cal 15 5 - 40 mg/dL   LDL Calculated 734 (H) 0 - 99 mg/dL  Hgb L9F w/o eAG  Result Value Ref Range   Hgb A1c MFr Bld 5.8 (H) 4.8 - 5.6 %  TSH  Result Value Ref Range   TSH 0.577 0.450 - 4.500 uIU/mL      Assessment & Plan:   Problem List Items Addressed This Visit    None    Visit Diagnoses    Encounter for annual physical exam    -  Primary   Relevant Orders   TSH   Lipid panel   Comprehensive metabolic panel   Hemoglobin A1c   HIV Antibody (routine testing w rflx)   CBC with Differential/Platelet   Screening for HIV without presence of risk factors       Relevant  Orders   HIV Antibody (routine testing w rflx)   Screening for diabetes mellitus       Relevant Orders   Hemoglobin A1c      Annual physical exam without new findings.  Well adult with no acute  concerns.  Does have goals for weight loss and has started working to lose weight.  Final goal = 215 lbs.    Plan: 1. Obtain health maintenance screenings as above according to age. - Increase physical activity to 30 minutes most days of the week.  - Eat healthy diet high in vegetables and fruits; low in refined carbohydrates. - Screening labs and tests as ordered - Set realistic goal for patient together today of 25 lbs in 3 months.  Re-evaluate new goal every 3 months to reach final goal as above. 2. Return 1 year for annual physical.   Follow up plan: Return in about 1 year (around 05/25/2019) for annual physical.  Wilhelmina McardleLauren Dontario Evetts, DNP, AGPCNP-BC Adult Gerontology Primary Care Nurse Practitioner [facility] [place] [facility] 05/24/2018, 3:15 PM

## 2018-05-24 NOTE — Patient Instructions (Addendum)
Jamie Ortiz,   Thank you for coming in to clinic today.  1. Increase your physical activity until you are increasing your heart rate for 30 minutes on most days of the week.  2. Continue work to eat more vegetables, less carbohydrate and less fried foods for work toward weight loss. - Keep short term goal in mind every 3 months for weight loss.  215 is your long-term goal that could be on the calendar about 2 years from now.  3. You will be due for FASTING BLOOD WORK.  This means you should eat no food or drink after midnight.  Drink only water or coffee without cream/sugar on the morning of your lab visit. - Please go ahead and schedule a "Lab Only" visit in the morning at the clinic for lab draw in the next 7 days. - Your results will be available about 2-3 days after blood draw.  If you have set up a MyChart account, you can can log in to MyChart online to view your results and a brief explanation. Also, we can discuss your results together at your next office visit if you would like.  Please schedule a follow-up appointment with Wilhelmina Mcardle, AGNP. Return in about 1 year (around 05/25/2019) for annual physical.  If you have any other questions or concerns, please feel free to call the clinic or send a message through MyChart. You may also schedule an earlier appointment if necessary.  You will receive a survey after today's visit either digitally by e-mail or paper by [employer]. Your experiences and feedback matter to [place].  Please respond so we know how we are doing as we provide care for you.   Wilhelmina Mcardle, DNP, AGNP-BC Adult Gerontology Nurse Practitioner [facility]

## 2018-05-31 ENCOUNTER — Telehealth: Payer: Self-pay | Admitting: Nurse Practitioner

## 2018-05-31 ENCOUNTER — Other Ambulatory Visit: Payer: Self-pay

## 2018-05-31 DIAGNOSIS — Z131 Encounter for screening for diabetes mellitus: Secondary | ICD-10-CM | POA: Diagnosis not present

## 2018-05-31 DIAGNOSIS — Z0000 Encounter for general adult medical examination without abnormal findings: Secondary | ICD-10-CM

## 2018-05-31 DIAGNOSIS — Z114 Encounter for screening for human immunodeficiency virus [HIV]: Secondary | ICD-10-CM

## 2018-05-31 NOTE — Telephone Encounter (Signed)
Pt needs [facility] order sent to [facility] location.

## 2018-06-01 LAB — COMPREHENSIVE METABOLIC PANEL
ALT: 27 IU/L (ref 0–44)
AST: 19 IU/L (ref 0–40)
Albumin/Globulin Ratio: 1.6 (ref 1.2–2.2)
Albumin: 4.7 g/dL (ref 4.0–5.0)
Alkaline Phosphatase: 50 IU/L (ref 39–117)
BUN/Creatinine Ratio: 9 (ref 9–20)
BUN: 10 mg/dL (ref 6–20)
Bilirubin Total: 0.5 mg/dL (ref 0.0–1.2)
CO2: 22 mmol/L (ref 20–29)
Calcium: 9.3 mg/dL (ref 8.7–10.2)
Chloride: 104 mmol/L (ref 96–106)
Creatinine, Ser: 1.06 mg/dL (ref 0.76–1.27)
GFR calc Af Amer: 108 mL/min/{1.73_m2} (ref >59)
GFR calc non Af Amer: 93 mL/min/{1.73_m2} (ref >59)
Globulin, Total: 2.9 g/dL (ref 1.5–4.5)
Glucose: 79 mg/dL (ref 65–99)
Potassium: 4.6 mmol/L (ref 3.5–5.2)
Sodium: 141 mmol/L (ref 134–144)
Total Protein: 7.6 g/dL (ref 6.0–8.5)

## 2018-06-01 LAB — HIV ANTIBODY (ROUTINE TESTING W REFLEX): HIV Screen 4th Generation wRfx: NONREACTIVE

## 2018-06-01 LAB — LIPID PANEL
Chol/HDL Ratio: 4.2 ratio (ref 0.0–5.0)
Cholesterol, Total: 177 mg/dL (ref 100–199)
HDL: 42 mg/dL (ref >39)
LDL Calculated: 119 mg/dL — ABNORMAL HIGH (ref 0–99)
Triglycerides: 81 mg/dL (ref 0–149)
VLDL Cholesterol Cal: 16 mg/dL (ref 5–40)

## 2018-06-01 LAB — CBC WITH DIFFERENTIAL/PLATELET
Basophils Absolute: 0 10*3/uL (ref 0.0–0.2)
Basos: 1 %
EOS (ABSOLUTE): 0.1 10*3/uL (ref 0.0–0.4)
Eos: 4 %
Hematocrit: 43.5 % (ref 37.5–51.0)
Hemoglobin: 14.3 g/dL (ref 13.0–17.7)
Immature Grans (Abs): 0 10*3/uL (ref 0.0–0.1)
Immature Granulocytes: 0 %
Lymphocytes Absolute: 1.1 10*3/uL (ref 0.7–3.1)
Lymphs: 36 %
MCH: 27.9 pg (ref 26.6–33.0)
MCHC: 32.9 g/dL (ref 31.5–35.7)
MCV: 85 fL (ref 79–97)
Monocytes Absolute: 0.4 10*3/uL (ref 0.1–0.9)
Monocytes: 14 %
Neutrophils Absolute: 1.3 10*3/uL — ABNORMAL LOW (ref 1.4–7.0)
Neutrophils: 45 %
Platelets: 255 10*3/uL (ref 150–450)
RBC: 5.13 x10E6/uL (ref 4.14–5.80)
RDW: 13.4 % (ref 11.6–15.4)
WBC: 3 10*3/uL — ABNORMAL LOW (ref 3.4–10.8)

## 2018-06-01 LAB — HEMOGLOBIN A1C
Est. average glucose Bld gHb Est-mCnc: 117 mg/dL
Hgb A1c MFr Bld: 5.7 % — ABNORMAL HIGH (ref 4.8–5.6)

## 2018-06-01 LAB — TSH: TSH: 0.564 u[IU]/mL (ref 0.450–4.500)

## 2018-12-27 ENCOUNTER — Other Ambulatory Visit: Payer: Self-pay

## 2018-12-27 ENCOUNTER — Ambulatory Visit
Admission: EM | Admit: 2018-12-27 | Discharge: 2018-12-27 | Disposition: A | Discharge status: Home / Self Care | Payer: 59 | Attending: Family Medicine | Admitting: Family Medicine

## 2018-12-27 ENCOUNTER — Encounter: Payer: Self-pay | Admitting: Emergency Medicine

## 2018-12-27 DIAGNOSIS — H60311 Diffuse otitis externa, right ear: Secondary | ICD-10-CM

## 2018-12-27 DIAGNOSIS — H6692 Otitis media, unspecified, left ear: Secondary | ICD-10-CM

## 2018-12-27 DIAGNOSIS — H6691 Otitis media, unspecified, right ear: Secondary | ICD-10-CM

## 2018-12-27 MED ORDER — OFLOXACIN 0.3 % OT SOLN: 10.0000 [drp] | Freq: Every day | OTIC | 0 refills | Status: AC

## 2018-12-27 MED ORDER — AMOXICILLIN-POT CLAVULANATE 875-125 MG PO TABS: 1.0000 | ORAL_TABLET | Freq: Two times a day (BID) | ORAL | 0 refills | Status: DC

## 2018-12-27 NOTE — Discharge Instructions (Addendum)
Take medication as prescribed. Rest. Drink plenty of fluids. Keep right ear dry.   Follow up with your primary care physician this week as needed. Return to Urgent care for new or worsening concerns.

## 2018-12-27 NOTE — ED Provider Notes (Signed)
[facility] ____________________________________________  Time seen: Approximately 9:21 AM  I have reviewed the triage vital signs and the nursing notes.   HISTORY  Chief Complaint Otalgia   HPI Jamie Ortiz is a 33 y.o. male presenting for evaluation of 1 week of gradual onset of ear pain.  Reports initially it was more of his left ear but now more of his right ear.  States right ear is painful to touch as well as feels like painful inside.  Has applied heat and ice without resolution.  Denies other aggravating alleviating factors.  Denies accompanying cough, congestion, sore throat, fevers, chest pain, shortness of breath or other complaints.  Reports otherwise doing well.  States feels consistent with previous ear infection.  Does wear earplugs a lot at work and concerned this may have triggered it.  Galen Manila, NP: PCP    Past Medical History:  Diagnosis Date  . Morbid obesity [facility])     Patient Active Problem List   Diagnosis Date Noted  . BMI 40.0-44.9, adult (HCC) 02/09/2015    Past Surgical History:  Procedure Laterality Date  . NO PAST SURGERIES       No current facility-administered medications for this encounter.   Current Outpatient Medications:  .  amoxicillin-clavulanate (AUGMENTIN) 875-125 MG tablet, Take 1 tablet by mouth every 12 (twelve) hours., Disp: 20 tablet, Rfl: 0 .  ofloxacin (FLOXIN) 0.3 % OTIC solution, Place 10 drops into the right ear daily for 7 days., Disp: 5 mL, Rfl: 0 .  VITAMIN D, ERGOCALCIFEROL, PO, Take by mouth., Disp: , Rfl:  .  vitamin E 200 UNIT capsule, Take 200 Units by mouth daily., Disp: , Rfl:   Allergies Patient has no known allergies.  Family History  Problem Relation Age of Onset  . Hypertension Mother   . Cancer Father        lung, prostate  . Healthy Sister     Social History Social History   Tobacco Use  . Smoking status: Never Smoker  . Smokeless tobacco: Never Used  Substance Use  Topics  . Alcohol use: No  . Drug use: No    Review of Systems Constitutional: No fever ENT: No sore throat. As above.  Cardiovascular: Denies chest pain. Respiratory: Denies shortness of breath. Gastrointestinal: No abdominal pain.   Musculoskeletal: Negative for back pain. Skin: Negative for rash.  ____________________________________________   PHYSICAL EXAM:  VITAL SIGNS: ED Triage Vitals  Enc Vitals Group     BP 12/27/18 0850 (!) 142/98     Pulse Rate 12/27/18 0850 61     Resp 12/27/18 0850 16     Temp 12/27/18 0850 98.2 F (36.8 C)     Temp Source 12/27/18 0850 Oral     SpO2 12/27/18 0850 99 %     Weight 12/27/18 0847 (!) 315 lb (142.9 kg)     Height 12/27/18 0847 6\' 1"  (1.854 m)     Head Circumference --      Peak Flow --      Pain Score 12/27/18 0847 7     Pain Loc --      Pain Edu? --      Excl. in GC? --    Constitutional: Alert and oriented. Well appearing and in no acute distress. Eyes: Conjunctivae are normal.  Head: Atraumatic. No swelling. No erythema.  Ears: No mastoid tenderness bilaterally.  Left: Nontender, normal canal, moderate erythema TM with TM dullness.  Right: Tenderness with auricle movement, mild to  moderate canal erythema and edema with scant exudate, TM erythematous, TM appears intact.  Nose:No nasal congestion  Hematological/Lymphatic/Immunilogical: No cervical lymphadenopathy. Cardiovascular: Normal rate, regular rhythm. Grossly normal heart sounds.  Good peripheral circulation. Respiratory: Normal respiratory effort.  No retractions. No wheezes, rales or rhonchi. Good air movement.  Musculoskeletal: Ambulatory with steady gait. Neurologic:  Normal speech and language. No gait instability. Skin:  Skin appears warm, dry and intact. No rash noted. Psychiatric: Mood and affect are normal. Speech and behavior are normal. ___________________________________________   LABS (all labs ordered are listed, but only abnormal results are  displayed)  Labs Reviewed - No data to display  PROCEDURES Procedures    INITIAL IMPRESSION / ASSESSMENT AND PLAN / ED COURSE  Pertinent labs & imaging results that were available during my care of the patient were reviewed by me and considered in my medical decision making (see chart for details).  Well-appearing patient.  No acute distress.  Bilateral otitis media with right otitis externa.  Will treat with oral Augmentin and ofloxacin.  Supportive care over-the-counter improvement.Discussed indication, risks and benefits of medications with patient.  Discussed follow up with Primary care physician this week. Discussed follow up and return parameters including no resolution or any worsening concerns. Patient verbalized understanding and agreed to plan.   ____________________________________________   FINAL CLINICAL IMPRESSION(S) / ED DIAGNOSES  Final diagnoses:  Right otitis media, unspecified otitis media type  Acute diffuse otitis externa of right ear  Left otitis media, unspecified otitis media type     ED Discharge Orders         Ordered    amoxicillin-clavulanate (AUGMENTIN) 875-125 MG tablet  Every 12 hours     12/27/18 0900    ofloxacin (FLOXIN) 0.3 % OTIC solution  Daily     12/27/18 0900           Note: This dictation was prepared with Dragon dictation along with smaller phrase technology. Any transcriptional errors that result from this process are unintentional.         Marylene Land, NP 12/27/18 [phone]

## 2018-12-27 NOTE — ED Triage Notes (Signed)
Patient c/o right ear pain for a week.  Patient states that his left ear was hurting first then moved to his right ear.  Patient denies fevers.

## 2019-01-13 ENCOUNTER — Other Ambulatory Visit: Payer: Self-pay

## 2019-01-13 ENCOUNTER — Ambulatory Visit
Admission: EM | Admit: 2019-01-13 | Discharge: 2019-01-13 | Disposition: A | Discharge status: Home / Self Care | Payer: 59 | Attending: Family Medicine | Admitting: Family Medicine

## 2019-01-13 ENCOUNTER — Encounter: Payer: Self-pay | Admitting: Emergency Medicine

## 2019-01-13 DIAGNOSIS — H9202 Otalgia, left ear: Secondary | ICD-10-CM | POA: Diagnosis not present

## 2019-01-13 DIAGNOSIS — H6012 Cellulitis of left external ear: Secondary | ICD-10-CM

## 2019-01-13 MED ORDER — MUPIROCIN 2 % EX OINT: TOPICAL_OINTMENT | CUTANEOUS | 0 refills | Status: DC

## 2019-01-13 MED ORDER — AMOXICILLIN-POT CLAVULANATE 875-125 MG PO TABS: 1.0000 | ORAL_TABLET | Freq: Two times a day (BID) | ORAL | 0 refills | Status: DC

## 2019-01-13 NOTE — Discharge Instructions (Addendum)
Take medication as prescribed. Avoid ear buds. Monitor.   Follow up with your primary care physician this week as needed. Return to Urgent care for new or worsening concerns.

## 2019-01-13 NOTE — ED Triage Notes (Signed)
Patient in office this morning stated that he has a knot in left ear with pain started last night.  OTC: used otic solution

## 2019-01-13 NOTE — ED Provider Notes (Signed)
[facility] ____________________________________________  Time seen: Approximately 10:23 AM  I have reviewed the triage vital signs and the nursing notes.   HISTORY  Chief Complaint Ear Pain   HPI Jamie Ortiz is a 33 y.o. male presenting for evaluation of left ear pain for the last 2 days.  Patient was seen in urgent care similarly 2 weeks ago for same and that started similar and then progressed and worsened.  Patient completed his ofloxacin drops and oral Augmentin and right ear discomfort fully resolved.  Patient reports he had a small bump to the outer left ear a few days ago that he popped and then today noticed another bump that is tender without any drainage.  Does wear ear buds daily at work that are noise canceling expresses concern that this rubs against that same area.  Denies any other changes.  Denies inner ear pain.  No hearing changes.  No drainage.  Reports otherwise doing well.  No fevers.   Past Medical History:  Diagnosis Date  . Morbid obesity [facility])     Patient Active Problem List   Diagnosis Date Noted  . BMI 40.0-44.9, adult (HCC) 02/09/2015    Past Surgical History:  Procedure Laterality Date  . NO PAST SURGERIES       No current facility-administered medications for this encounter.   Current Outpatient Medications:  .  amoxicillin-clavulanate (AUGMENTIN) 875-125 MG tablet, Take 1 tablet by mouth every 12 (twelve) hours., Disp: 20 tablet, Rfl: 0 .  mupirocin ointment (BACTROBAN) 2 %, Apply two times a day for 7 days., Disp: 22 g, Rfl: 0 .  VITAMIN D, ERGOCALCIFEROL, PO, Take by mouth., Disp: , Rfl:  .  vitamin E 200 UNIT capsule, Take 200 Units by mouth daily., Disp: , Rfl:   Allergies Patient has no known allergies.  Family History  Problem Relation Age of Onset  . Hypertension Mother   . Cancer Father        lung, prostate  . Healthy Sister     Social History Social History   Tobacco Use  . Smoking status: Never Smoker   . Smokeless tobacco: Never Used  Substance Use Topics  . Alcohol use: No  . Drug use: No    Review of Systems Constitutional: No fever ENT: No sore throat.  Positive left ear pain. Cardiovascular: Denies chest pain. Respiratory: Denies shortness of breath. Gastrointestinal: No abdominal pain.   Musculoskeletal: Negative for back pain. Skin: Negative for rash.   ____________________________________________   PHYSICAL EXAM:  VITAL SIGNS: ED Triage Vitals  Enc Vitals Group     BP 01/13/19 0924 (!) 144/84     Pulse Rate 01/13/19 0924 73     Resp 01/13/19 0924 18     Temp 01/13/19 0924 98.8 F (37.1 C)     Temp Source 01/13/19 0924 Oral     SpO2 01/13/19 0924 97 %     Weight 01/13/19 0921 (!) 305 lb (138.3 kg)     Height --      Head Circumference --      Peak Flow --      Pain Score 01/13/19 0921 8     Pain Loc --      Pain Edu? --      Excl. in GC? --     Constitutional: Alert and oriented. Well appearing and in no acute distress. Eyes: Conjunctivae are normal. ENT      Head: Normocephalic and atraumatic.      Ears: Right:  Nontender, base of external ear canal small non-pointing pimple without tenderness or erythema, canal normal, no erythema, normal TM.  Left: Nontender to external movement, base of external ear canal mild localized area of edema with mild erythema, non-pointing, canal clear without erythema or edema or drainage, normal TM.  No surrounding tenderness, swelling or erythema. Cardiovascular: Normal rate, regular rhythm. Grossly normal heart sounds.  Good peripheral circulation. Respiratory: Normal respiratory effort without tachypnea nor retractions. Breath sounds are clear and equal bilaterally. No wheezes, rales, rhonchi. Musculoskeletal: Steady. Neurologic:  Normal speech and language.  Skin:  Skin is warm, dry and intact. No rash noted. Psychiatric: Mood and affect are normal. Speech and behavior are normal. Patient exhibits appropriate insight and  judgment   ___________________________________________   LABS (all labs ordered are listed, but only abnormal results are displayed)  Labs Reviewed - No data to display   PROCEDURES Procedures   INITIAL IMPRESSION / ASSESSMENT AND PLAN / ED COURSE  Pertinent labs & imaging results that were available during my care of the patient were reviewed by me and considered in my medical decision making (see chart for details).  Well-appearing patient.  No acute distress.  Patient has mild cellulitis located to external auditory canal lower ear, suspect second to repetitive earbud use.  Inner ear well-appearing.  Will treat with Augmentin and topical Bactroban.  Warm compress.  Monitor.  Avoid irritating factors.Discussed indication, risks and benefits of medications with patient.  Discussed follow up with Primary care physician this week. Discussed follow up and return parameters including no resolution or any worsening concerns. Patient verbalized understanding and agreed to plan.   ____________________________________________   FINAL CLINICAL IMPRESSION(S) / ED DIAGNOSES  Final diagnoses:  Cellulitis of left ear     ED Discharge Orders         Ordered    amoxicillin-clavulanate (AUGMENTIN) 875-125 MG tablet  Every 12 hours     01/13/19 0955    mupirocin ointment (BACTROBAN) 2 %     01/13/19 0955           Note: This dictation was prepared with Dragon dictation along with smaller phrase technology. Any transcriptional errors that result from this process are unintentional.         Marylene Land, NP 01/13/19 1026

## 2019-05-30 ENCOUNTER — Other Ambulatory Visit: Payer: Self-pay

## 2019-05-30 ENCOUNTER — Encounter: Payer: Self-pay | Admitting: Family Medicine

## 2019-05-30 ENCOUNTER — Ambulatory Visit (INDEPENDENT_AMBULATORY_CARE_PROVIDER_SITE_OTHER): Payer: 59 | Admitting: Family Medicine

## 2019-05-30 DIAGNOSIS — R635 Abnormal weight gain: Secondary | ICD-10-CM | POA: Diagnosis not present

## 2019-05-30 DIAGNOSIS — R7303 Prediabetes: Secondary | ICD-10-CM

## 2019-05-30 DIAGNOSIS — L659 Nonscarring hair loss, unspecified: Secondary | ICD-10-CM

## 2019-05-30 DIAGNOSIS — Z0000 Encounter for general adult medical examination without abnormal findings: Secondary | ICD-10-CM | POA: Diagnosis not present

## 2019-05-30 NOTE — Assessment & Plan Note (Addendum)
Has had a 17lb weight gain from last visit without structured diet/exercise.  Counseled on heart healthy, low carb, low glycemic diet for cardiovascular health and prediabetes.  Has a goal to begin getting better and getting back into a structured workout regimen.    Had concern for hair loss, believes that he has worn a hat more than he should have and that he sleeps on his stomach with his head rough on the pillow.  Believes that may have contributed to his current hair loss concerns.  States father had full jheri curls and a full head of hair.  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  Will have labs repeated within the next 1-2 weeks and will call with results.

## 2019-05-30 NOTE — Patient Instructions (Signed)
As we discussed, begin working on a heart healthy diet that is low carb and low glycemic index and increase your exercise to at least 30 minutes every other day, going no more than 2 days in a row without exercise.    Have your labs drawn and as we discussed, avoid all multivitamins and supplements for 1 week before having the heavy metal blood work screening completed.  Will call you with the results  You will receive a survey after today's visit either digitally by e-mail or paper by USPS mail. Your experiences and feedback matter to [place].  Please respond so we know how we are doing as we provide care for you.  Call us with any questions/concerns/needs.  It is my goal to be available to you for your health concerns.  Thanks for choosing me to be a partner in your healthcare needs!  Charlaine Dalton, FNP-C Family Nurse Practitioner [facility] Phone: [phone]

## 2019-05-30 NOTE — Assessment & Plan Note (Signed)
See routine medical exam A/P 

## 2019-05-30 NOTE — Progress Notes (Signed)
Subjective:    Patient ID: Jamie Ortiz, male    DOB: [birth year], 34 y.o.   MRN: [id number]  Jamie Ortiz is a 34 y.o. male presenting on 05/30/2019 for Annual Exam   HPI  HEALTH MAINTENANCE:  Weight/BMI: 17lb weight gain since last visit  Physical activity: No structured exercise regimen, but is going to begin getting back into a workout routine Diet: Nothing structured but will be working on this Seatbelt: 100% of the time Sunscreen: Wearing in the sun Optometry: Every year Dentistry: Every 6 months  IMMUNIZATIONS: Influenza: Declined Tetanus: 12/26/2016 COVID: Discussed and does not plan on obtaining when available   Depression screen [facility] 2/9 05/30/2019 05/24/2018 06/14/2017  Decreased Interest 0 0 0  Down, Depressed, Hopeless 0 0 0  PHQ - 2 Score 0 0 0    Past Medical History:  Diagnosis Date  . Morbid obesity (HCC)    Past Surgical History:  Procedure Laterality Date  . NO PAST SURGERIES     Social History   Socioeconomic History  . Marital status: Single    Spouse name: Not on file  . Number of children: Not on file  . Years of education: Not on file  . Highest education level: High school graduate  Occupational History  . Not on file  Tobacco Use  . Smoking status: Never Smoker  . Smokeless tobacco: Never Used  Substance and Sexual Activity  . Alcohol use: No  . Drug use: No  . Sexual activity: Yes    Birth control/protection: None  Other Topics Concern  . Not on file  Social History Narrative  . Not on file   Social Determinants of Health   Financial Resource Strain:   . Difficulty of Paying Living Expenses: Not on file  Food Insecurity:   . Worried About Programme researcher, broadcasting/film/video in the Last Year: Not on file  . Ran Out of Food in the Last Year: Not on file  Transportation Needs:   . Lack of Transportation (Medical): Not on file  . Lack of Transportation (Non-Medical): Not on file  Physical Activity:   . Days of Exercise per Week: Not on file    . Minutes of Exercise per Session: Not on file  Stress:   . Feeling of Stress : Not on file  Social Connections:   . Frequency of Communication with Friends and Family: Not on file  . Frequency of Social Gatherings with Friends and Family: Not on file  . Attends Religious Services: Not on file  . Active Member of Clubs or Organizations: Not on file  . Attends Banker Meetings: Not on file  . Marital Status: Not on file  Intimate Partner Violence:   . Fear of Current or Ex-Partner: Not on file  . Emotionally Abused: Not on file  . Physically Abused: Not on file  . Sexually Abused: Not on file   Family History  Problem Relation Age of Onset  . Hypertension Mother   . Cancer Father        lung, prostate  . Healthy Sister    Current Outpatient Medications on File Prior to Visit  Medication Sig  . Multiple Vitamins-Minerals (MENS MULTI VITAMIN & MINERAL PO) Take by mouth.   No current facility-administered medications on file prior to visit.    Per HPI unless specifically indicated above     Objective:    BP 122/71 (BP Location: Right Arm, Patient Position: Sitting, Cuff Size: Large)   Pulse 61  Temp (!) 97.1 F (36.2 C) (Oral)   Ht 6\' 1"  (1.854 m)   Wt (!) 322 lb (146.1 kg)   BMI 42.48 kg/m   Wt Readings from Last 3 Encounters:  05/30/19 (!) 322 lb (146.1 kg)  01/13/19 (!) 305 lb (138.3 kg)  12/27/18 (!) 315 lb (142.9 kg)    Physical Exam Vitals reviewed.  Constitutional:      General: He is not in acute distress.    Appearance: Normal appearance. He is well-groomed. He is obese. He is not ill-appearing or toxic-appearing.  HENT:     Head: Normocephalic.  Eyes:     General: Lids are normal. Vision grossly intact.        Right eye: No discharge.        Left eye: No discharge.     Extraocular Movements: Extraocular movements intact.     Conjunctiva/sclera: Conjunctivae normal.     Pupils: Pupils are equal, round, and reactive to light.   Cardiovascular:     Rate and Rhythm: Normal rate and regular rhythm.     Pulses: Normal pulses.          Dorsalis pedis pulses are 2+ on the right side and 2+ on the left side.       Posterior tibial pulses are 2+ on the right side and 2+ on the left side.     Heart sounds: Normal heart sounds. No murmur. No friction rub. No gallop.   Pulmonary:     Effort: Pulmonary effort is normal. No respiratory distress.     Breath sounds: Normal breath sounds.  Abdominal:     General: Abdomen is flat. Bowel sounds are normal. There is no distension.     Palpations: Abdomen is soft. There is no hepatomegaly, splenomegaly or mass.  Musculoskeletal:        General: Normal range of motion.     Right lower leg: No edema.     Left lower leg: No edema.  Feet:     Right foot:     Skin integrity: Skin integrity normal.     Left foot:     Skin integrity: Skin integrity normal.  Skin:    General: Skin is warm and dry.     Capillary Refill: Capillary refill takes less than 2 seconds.  Neurological:     General: No focal deficit present.     Mental Status: He is alert and oriented to person, place, and time.     Cranial Nerves: Cranial nerves are intact. No cranial nerve deficit.     Sensory: Sensation is intact.     Motor: Motor function is intact.     Coordination: Coordination is intact.     Gait: Gait is intact. Gait normal.  Psychiatric:        Attention and Perception: Attention and perception normal.        Mood and Affect: Mood and affect normal.        Speech: Speech normal.        Behavior: Behavior normal. Behavior is cooperative.        Thought Content: Thought content normal.        Cognition and Memory: Cognition and memory normal.        Judgment: Judgment normal.    Results for orders placed or performed in visit on 05/31/18  CBC w/Diff/Platelet  Result Value Ref Range   WBC 3.0 (L) 3.4 - 10.8 x10E3/uL   RBC 5.13 4.14 - 5.80 x10E6/uL  Hemoglobin 14.3 13.0 - 17.7 g/dL    Hematocrit 99.3 71.6 - 51.0 %   MCV 85 79 - 97 fL   MCH 27.9 26.6 - 33.0 pg   MCHC 32.9 31.5 - 35.7 g/dL   RDW 96.7 89.3 - 81.0 %   Platelets 255 150 - 450 x10E3/uL   Neutrophils 45 Not Estab. %   Lymphs 36 Not Estab. %   Monocytes 14 Not Estab. %   Eos 4 Not Estab. %   Basos 1 Not Estab. %   Neutrophils Absolute 1.3 (L) 1.4 - 7.0 x10E3/uL   Lymphocytes Absolute 1.1 0.7 - 3.1 x10E3/uL   Monocytes Absolute 0.4 0.1 - 0.9 x10E3/uL   EOS (ABSOLUTE) 0.1 0.0 - 0.4 x10E3/uL   Basophils Absolute 0.0 0.0 - 0.2 x10E3/uL   Immature Granulocytes 0 Not Estab. %   Immature Grans (Abs) 0.0 0.0 - 0.1 x10E3/uL  HIV antibody (with reflex)  Result Value Ref Range   HIV Screen 4th Generation wRfx Non Reactive Non Reactive  Hemoglobin A1c  Result Value Ref Range   Hgb A1c MFr Bld 5.7 (H) 4.8 - 5.6 %   Est. average glucose Bld gHb Est-mCnc 117 mg/dL  Lipid Profile  Result Value Ref Range   Cholesterol, Total 177 100 - 199 mg/dL   Triglycerides 81 0 - 149 mg/dL   HDL 42 >17 mg/dL   VLDL Cholesterol Cal 16 5 - 40 mg/dL   LDL Calculated 510 (H) 0 - 99 mg/dL   Chol/HDL Ratio 4.2 0.0 - 5.0 ratio  Comprehensive metabolic panel  Result Value Ref Range   Glucose 79 65 - 99 mg/dL   BUN 10 6 - 20 mg/dL   Creatinine, Ser 2.58 0.76 - 1.27 mg/dL   GFR calc non Af Amer 93 >59 mL/min/1.73   GFR calc Af Amer 108 >59 mL/min/1.73   BUN/Creatinine Ratio 9 9 - 20   Sodium 141 134 - 144 mmol/L   Potassium 4.6 3.5 - 5.2 mmol/L   Chloride 104 96 - 106 mmol/L   CO2 22 20 - 29 mmol/L   Calcium 9.3 8.7 - 10.2 mg/dL   Total Protein 7.6 6.0 - 8.5 g/dL   Albumin 4.7 4.0 - 5.0 g/dL   Globulin, Total 2.9 1.5 - 4.5 g/dL   Albumin/Globulin Ratio 1.6 1.2 - 2.2   Bilirubin Total 0.5 0.0 - 1.2 mg/dL   Alkaline Phosphatase 50 39 - 117 IU/L   AST 19 0 - 40 IU/L   ALT 27 0 - 44 IU/L  TSH  Result Value Ref Range   TSH 0.564 0.450 - 4.500 uIU/mL      Assessment & Plan:   Problem List Items Addressed This Visit       Other   Routine medical exam    Has had a 17lb weight gain from last visit without structured diet/exercise.  Counseled on heart healthy, low carb, low glycemic diet for cardiovascular health and prediabetes.  Has a goal to begin getting better and getting back into a structured workout regimen.    Had concern for hair loss, believes that he has worn a hat more than he should have and that he sleeps on his stomach with his head rough on the pillow.  Believes that may have contributed to his current hair loss concerns.  States father had full jheri curls and a full head of hair.  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  Will have labs repeated within the next 1-2 weeks and will call with results.      Prediabetes    See routine medical exam A/P tab      Relevant Orders   CBC with Differential   COMPLETE METABOLIC PANEL WITH GFR   Thyroid Panel With TSH   HgB A1c   Hair loss    See routine medical exam A/P      Relevant Orders   Heavy Metals Profile II, Blood    Other Visit Diagnoses    Obesity, morbid, BMI 40.0-49.9 (HCC)    -  Primary   Relevant Orders   CBC with Differential   COMPLETE METABOLIC PANEL WITH GFR   Thyroid Panel With TSH   HgB A1c   Lipid Profile   Weight gain       Relevant Orders   Thyroid Panel With TSH      No orders of the defined types were placed in this encounter.    Follow up plan: Return in about 6 months (around 11/27/2019) for Prediabetes follow up.  Charlaine Dalton, FNP-C Family Nurse Practitioner [facility] 05/30/2019, 1:32 PM

## 2019-05-30 NOTE — Assessment & Plan Note (Signed)
See routine medical exam A/P tab

## 2019-06-01 NOTE — Progress Notes (Signed)
I have reviewed this encounter including the documentation in this note and/or discussed this patient with the provider, Danielle Rankin FNP. I am certifying that I agree with the content of this note as supervising physician.  Saralyn Pilar, DO [facility] 06/01/2019, 11:16 AM

## 2019-06-02 ENCOUNTER — Other Ambulatory Visit: Payer: 59

## 2019-06-02 ENCOUNTER — Other Ambulatory Visit: Payer: Self-pay

## 2019-06-03 ENCOUNTER — Other Ambulatory Visit: Payer: Self-pay | Admitting: Family Medicine

## 2019-06-03 DIAGNOSIS — L659 Nonscarring hair loss, unspecified: Secondary | ICD-10-CM

## 2019-06-03 LAB — COMPLETE METABOLIC PANEL WITH GFR
AG Ratio: 1.4 (calc) (ref 1.0–2.5)
ALT: 23 U/L (ref 9–46)
AST: 18 U/L (ref 10–40)
Albumin: 4.4 g/dL (ref 3.6–5.1)
Alkaline phosphatase (APISO): 49 U/L (ref 36–130)
BUN: 15 mg/dL (ref 7–25)
CO2: 27 mmol/L (ref 20–32)
Calcium: 9.6 mg/dL (ref 8.6–10.3)
Chloride: 104 mmol/L (ref 98–110)
Creat: 0.97 mg/dL (ref 0.60–1.35)
GFR, Est African American: 119 mL/min/{1.73_m2} (ref >=60)
GFR, Est Non African American: 103 mL/min/{1.73_m2} (ref >=60)
Globulin: 3.1 g/dL (calc) (ref 1.9–3.7)
Glucose, Bld: 86 mg/dL (ref 65–99)
Potassium: 4.4 mmol/L (ref 3.5–5.3)
Sodium: 138 mmol/L (ref 135–146)
Total Bilirubin: 0.4 mg/dL (ref 0.2–1.2)
Total Protein: 7.5 g/dL (ref 6.1–8.1)

## 2019-06-03 LAB — HEMOGLOBIN A1C
Hgb A1c MFr Bld: 5.7 % of total Hgb — ABNORMAL HIGH (ref <5.7)
Mean Plasma Glucose: 117 (calc)
eAG (mmol/L): 6.5 (calc)

## 2019-06-03 LAB — CBC WITH DIFFERENTIAL/PLATELET
Absolute Monocytes: 385 cells/uL (ref 200–950)
Basophils Absolute: 40 cells/uL (ref 0–200)
Basophils Relative: 1.1 %
Eosinophils Absolute: 176 cells/uL (ref 15–500)
Eosinophils Relative: 4.9 %
HCT: 44.5 % (ref 38.5–50.0)
Hemoglobin: 14.9 g/dL (ref 13.2–17.1)
Lymphs Abs: 1404 cells/uL (ref 850–3900)
MCH: 27.9 pg (ref 27.0–33.0)
MCHC: 33.5 g/dL (ref 32.0–36.0)
MCV: 83.3 fL (ref 80.0–100.0)
MPV: 10.9 fL (ref 7.5–12.5)
Monocytes Relative: 10.7 %
Neutro Abs: 1595 cells/uL (ref 1500–7800)
Neutrophils Relative %: 44.3 %
Platelets: 266 10*3/uL (ref 140–400)
RBC: 5.34 10*6/uL (ref 4.20–5.80)
RDW: 13.1 % (ref 11.0–15.0)
Total Lymphocyte: 39 %
WBC: 3.6 10*3/uL — ABNORMAL LOW (ref 3.8–10.8)

## 2019-06-03 LAB — THYROID PANEL WITH TSH
Free Thyroxine Index: 2 (ref 1.4–3.8)
T3 Uptake: 32 % (ref 22–35)
T4, Total: 6.3 ug/dL (ref 4.9–10.5)
TSH: 0.75 mIU/L (ref 0.40–4.50)

## 2019-06-03 LAB — LIPID PANEL
Cholesterol: 187 mg/dL (ref <200)
HDL: 45 mg/dL (ref >=40)
LDL Cholesterol (Calc): 122 mg/dL (calc) — ABNORMAL HIGH
Non-HDL Cholesterol (Calc): 142 mg/dL (calc) — ABNORMAL HIGH (ref <130)
Total CHOL/HDL Ratio: 4.2 (calc) (ref <5.0)
Triglycerides: 96 mg/dL (ref <150)

## 2019-06-03 NOTE — Progress Notes (Signed)
His labs still show pre-diabetes and are similar to his last labs in Feb 2020 at an A1C of 5.7%.  The rest of his labs are within normal limits.  His heavy metals screening lab hasn't returned.  Can you find out with [facility] when they expect that back?  Thanks

## 2019-06-03 NOTE — Progress Notes (Unsigned)
Reordered heavy metals lab so can be done in office through [facility]

## 2019-06-05 ENCOUNTER — Other Ambulatory Visit: Payer: 59

## 2019-06-05 ENCOUNTER — Other Ambulatory Visit: Payer: Self-pay

## 2019-06-09 LAB — HEAVY METALS PANEL, BLOOD
Arsenic: <10 mcg/L (ref <23)
Lead: 2 ug/dL (ref <5)
Mercury, B: <5 mcg/L (ref 0–10)

## 2019-06-09 NOTE — Progress Notes (Signed)
Heavy metals screening was negative.  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  Can send him to dermatology to discuss topical treatments that they can look into to help with hair regrowth.  If he is interested, I will put in a referral.  Thanks

## 2019-06-10 ENCOUNTER — Other Ambulatory Visit: Payer: Self-pay | Admitting: Family Medicine

## 2019-06-10 DIAGNOSIS — L659 Nonscarring hair loss, unspecified: Secondary | ICD-10-CM

## 2019-06-10 NOTE — Progress Notes (Signed)
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

## 2019-11-28 ENCOUNTER — Ambulatory Visit: Payer: 59 | Admitting: Family Medicine

## 2019-12-02 ENCOUNTER — Other Ambulatory Visit: Payer: Self-pay

## 2019-12-02 ENCOUNTER — Encounter: Payer: Self-pay | Admitting: Family Medicine

## 2019-12-02 ENCOUNTER — Ambulatory Visit (INDEPENDENT_AMBULATORY_CARE_PROVIDER_SITE_OTHER): Payer: 59 | Admitting: Family Medicine

## 2019-12-02 VITALS — BP 124/75 | HR 65 | Temp 98.7°F | Resp 17 | Ht 73.0 in | Wt 330.5 lb

## 2019-12-02 DIAGNOSIS — Z6841 Body Mass Index (BMI) 40.0 and over, adult: Secondary | ICD-10-CM

## 2019-12-02 DIAGNOSIS — R7303 Prediabetes: Secondary | ICD-10-CM

## 2019-12-02 DIAGNOSIS — M25562 Pain in left knee: Secondary | ICD-10-CM | POA: Diagnosis not present

## 2019-12-02 LAB — POCT GLYCOSYLATED HEMOGLOBIN (HGB A1C): Hemoglobin A1C: 5.9 % — AB (ref 4.0–5.6)

## 2019-12-02 NOTE — Assessment & Plan Note (Signed)
Left knee pain that has intermittent pain that lasts a few moments every 8-9 months.  Knee exam unremarkable.  Discussed likely MSK but if have symptoms that continue or progress to RTC for re-evaluation.

## 2019-12-02 NOTE — Patient Instructions (Signed)
As we discussed, Bernie Covey does currently partner with Noom (app for weight loss).  This can change at any time.  If you go to [email or web address] Click "Start for free today" Choose: [facility] Only Fill out the "About me" section with your information and email address *It will say, have you been prescribed Saxenda*.. The answer is "yes" Fill in your address  The next page will say "Personalize your Saxenda[facility] experience by choosing a coaching option now" Can click on "App-based support powered by Noom" Make a password and security question/answer  You will be emailed to confirm your account.  Download the app "Noom"  Can log in with the email address and password you just created  This app works to help build your relationship with food for long term success  We will plan to see you back in 6 months for prediabetes and weight check  You will receive a survey after today's visit either digitally by e-mail or paper by [employer]. Your experiences and feedback matter to [place].  Please respond so we know how we are doing as we provide care for you.  Call us with any questions/concerns/needs.  It is my goal to be available to you for your health concerns.  Thanks for choosing me to be a partner in your healthcare needs!  Charlaine Dalton, FNP-C Family Nurse Practitioner [facility] Phone: [phone]

## 2019-12-02 NOTE — Progress Notes (Signed)
Subjective:    Patient ID: Jamie Ortiz, male    DOB: 1986-02-08, 34 y.o.   MRN: [id number]  Jamie Ortiz is a 34 y.o. male presenting on 12/02/2019 for Weight Check and Knee Pain (intermittent left knee discomfort. He state it feel like he's experience some resistants wihen he bends the knee. x 1 yr )   HPI  Mr. Guiffre presents to clinic for follow up on his weight, his prediabetes and concerns for intermittent left knee pain.    Reports he had left knee pain that happens once every 8-9 months or so before spontaneously resolving.  Episodes usually last a few minutes or so and then resolve.  Has gained 8lbs in the last 6 months.  Reports he has cut out sodas and candies, but has not been consistent.  Is interested in lifestyle modifications to achieve slow and sustainable weight loss.  Depression screen [facility] 2/9 05/30/2019 05/24/2018 06/14/2017  Decreased Interest 0 0 0  Down, Depressed, Hopeless 0 0 0  PHQ - 2 Score 0 0 0    Social History   Tobacco Use  . Smoking status: Never Smoker  . Smokeless tobacco: Never Used  Vaping Use  . Vaping Use: Never used  Substance Use Topics  . Alcohol use: No  . Drug use: No    Review of Systems  Constitutional: Negative.   HENT: Negative.   Eyes: Negative.   Respiratory: Negative.   Cardiovascular: Negative.   Gastrointestinal: Negative.   Endocrine: Negative.   Genitourinary: Negative.   Musculoskeletal: Negative.   Skin: Negative.   Allergic/Immunologic: Negative.   Neurological: Negative.   Hematological: Negative.   Psychiatric/Behavioral: Negative.    Per HPI unless specifically indicated above     Objective:    BP 124/75 (BP Location: Left Arm, Patient Position: Sitting, Cuff Size: Large)   Pulse 65   Temp 98.7 F (37.1 C) (Oral)   Resp 17   Ht 6\' 1"  (1.854 m)   Wt (!) 330 lb 8 oz (149.9 kg)   SpO2 99%   BMI 43.60 kg/m   Wt Readings from Last 3 Encounters:  12/02/19 (!) 330 lb 8 oz (149.9 kg)  05/30/19 (!) 322  lb (146.1 kg)  01/13/19 (!) 305 lb (138.3 kg)    Physical Exam Vitals reviewed.  Constitutional:      General: He is not in acute distress.    Appearance: Normal appearance. He is well-developed and well-groomed. He is morbidly obese. He is not ill-appearing or toxic-appearing.  HENT:     Head: Normocephalic and atraumatic.     Nose:     Comments: 03/15/19 is in place, covering mouth and nose. Eyes:     General:        Right eye: No discharge.        Left eye: No discharge.     Extraocular Movements: Extraocular movements intact.     Conjunctiva/sclera: Conjunctivae normal.     Pupils: Pupils are equal, round, and reactive to light.  Cardiovascular:     Rate and Rhythm: Normal rate and regular rhythm.     Pulses: Normal pulses.     Heart sounds: Normal heart sounds. No murmur heard.  No friction rub. No gallop.   Pulmonary:     Effort: Pulmonary effort is normal. No respiratory distress.     Breath sounds: Normal breath sounds.  Musculoskeletal:     Right lower leg: No edema.     Left lower leg: No edema.  Skin:    General: Skin is warm and dry.     Capillary Refill: Capillary refill takes less than 2 seconds.  Neurological:     General: No focal deficit present.     Mental Status: He is alert and oriented to person, place, and time.  Psychiatric:        Attention and Perception: Attention and perception normal.        Mood and Affect: Mood and affect normal.        Speech: Speech normal.        Behavior: Behavior normal. Behavior is cooperative.        Thought Content: Thought content normal.        Cognition and Memory: Cognition and memory normal.    Results for orders placed or performed in visit on 12/02/19  POCT glycosylated hemoglobin (Hb A1C)  Result Value Ref Range   Hemoglobin A1C 5.9 (A) 4.0 - 5.6 %   HbA1c POC (<> result, manual entry)     HbA1c, POC (prediabetic range)     HbA1c, POC (controlled diabetic range)        Assessment & Plan:   Problem  List Items Addressed This Visit      Other   BMI 40.0-44.9, adult (HCC)    See prediabetes A/P      Prediabetes - Primary    A1C increase from 5.7% to 5.9% with an 8lb weight gain over the past 6 months.  Has been working on lifestyle modifications but reports has been inconsistent.  Is interested in pursuing sustainable lifestyle modifications.  Discussed trying to track calories and exercise, to continue to increase water intake, avoid sodas and candies.  Discussed typical 1-2lb weight loss per week can help make it sustainable.    Plan: 1. Work on lifestyle modifications 2. RTC in 6 months      Relevant Orders   POCT glycosylated hemoglobin (Hb A1C) (Completed)   Left knee pain    Left knee pain that has intermittent pain that lasts a few moments every 8-9 months.  Knee exam unremarkable.  Discussed likely MSK but if have symptoms that continue or progress to RTC for re-evaluation.         No orders of the defined types were placed in this encounter.   Follow up plan: Return in about 6 months (around 05/31/2020) for PreDM and weight check.   Charlaine Dalton, FNP Family Nurse Practitioner [facility] 12/02/2019, 11:52 AM

## 2019-12-02 NOTE — Assessment & Plan Note (Signed)
A1C increase from 5.7% to 5.9% with an 8lb weight gain over the past 6 months.  Has been working on lifestyle modifications but reports has been inconsistent.  Is interested in pursuing sustainable lifestyle modifications.  Discussed trying to track calories and exercise, to continue to increase water intake, avoid sodas and candies.  Discussed typical 1-2lb weight loss per week can help make it sustainable.    Plan: 1. Work on lifestyle modifications 2. RTC in 6 months

## 2019-12-02 NOTE — Assessment & Plan Note (Signed)
See prediabetes A/P

## 2020-03-01 ENCOUNTER — Ambulatory Visit (INDEPENDENT_AMBULATORY_CARE_PROVIDER_SITE_OTHER): Payer: 59 | Admitting: Family Medicine

## 2020-03-01 ENCOUNTER — Other Ambulatory Visit: Payer: Self-pay

## 2020-03-01 ENCOUNTER — Encounter: Payer: Self-pay | Admitting: Family Medicine

## 2020-03-01 VITALS — BP 122/71 | HR 61 | Temp 98.9°F | Resp 17 | Ht 73.0 in | Wt 310.5 lb

## 2020-03-01 DIAGNOSIS — M25562 Pain in left knee: Secondary | ICD-10-CM | POA: Diagnosis not present

## 2020-03-01 DIAGNOSIS — R200 Anesthesia of skin: Secondary | ICD-10-CM | POA: Insufficient documentation

## 2020-03-01 DIAGNOSIS — G8929 Other chronic pain: Secondary | ICD-10-CM

## 2020-03-01 DIAGNOSIS — R202 Paresthesia of skin: Secondary | ICD-10-CM | POA: Insufficient documentation

## 2020-03-01 NOTE — Assessment & Plan Note (Signed)
Chronic left knee pain > 1 year, with continued symptoms.  Has not met with Orthopedics or seen an improvement in symptoms.  Will refer to Orthopedics for evaluation.

## 2020-03-01 NOTE — Patient Instructions (Signed)
Have your labs drawn and we will contact you with the results.  A referral to Orthopedics has been placed today.  If you have not heard from the specialty office or our referral coordinator within 1 week, please let us know and we will follow up with the referral coordinator for an update.  As we discussed, if you would like to proceed to Orthopedics before you hear from the referral coordinator, you can proceed to [facility] at [address] [place], [place].  They have walk in urgent care Monday-Friday from 2pm-7:30pm.  We will plan to see you back after your visit with Orthopedics  You will receive a survey after today's visit either digitally by e-mail or paper by [employer] mail. Your experiences and feedback matter to [place].  Please respond so we know how we are doing as we provide care for you.  Call us with any questions/concerns/needs.  It is my goal to be available to you for your health concerns.  Thanks for choosing me to be a partner in your healthcare needs!  Charlaine Dalton, FNP-C Family Nurse Practitioner [facility] Phone: [phone]

## 2020-03-01 NOTE — Assessment & Plan Note (Signed)
Will have labs drawn for history of decreased WBC and for evaluation of B12, and A1C with parasthesia in feet.  Labs to be drawn today.

## 2020-03-01 NOTE — Assessment & Plan Note (Signed)
Likely radicular symptoms from the lower back.  Lower back exam negative in clinic.  With continued symptoms, will refer to Orthopedics, discussed likely needing MRI.  Patient in agreement with plan.

## 2020-03-01 NOTE — Progress Notes (Signed)
Subjective:    Patient ID: Ballard Budney, male    DOB: 1986/01/16, 34 y.o.   MRN: [id number]  Babacar Haycraft is a 34 y.o. male presenting on 03/01/2020 for Ankle Pain   HPI  Mr. Crapps presents to clinic with concerns of left leg numbness/tingling, bilateral foot tingling and continued left knee pain x 1 year.  Has recently been to the chiropractor with some improvement in lower extremity symptoms, but finds increased symptoms when he adjusts his weight from hip to hip.  Denies any injury/trauma, change in bowel/bladder function, saddle anesthesia, foot drop, changes in ambulation.  Depression screen [facility] 2/9 05/30/2019 05/24/2018 06/14/2017  Decreased Interest 0 0 0  Down, Depressed, Hopeless 0 0 0  PHQ - 2 Score 0 0 0    Social History   Tobacco Use  . Smoking status: Never Smoker  . Smokeless tobacco: Never Used  Vaping Use  . Vaping Use: Never used  Substance Use Topics  . Alcohol use: No  . Drug use: No    Review of Systems  Constitutional: Negative.   HENT: Negative.   Eyes: Negative.   Respiratory: Negative.   Cardiovascular: Negative.   Gastrointestinal: Negative.   Endocrine: Negative.   Genitourinary: Negative.   Musculoskeletal: Positive for arthralgias. Negative for back pain, gait problem, joint swelling, myalgias, neck pain and neck stiffness.  Skin: Negative.   Allergic/Immunologic: Negative.   Neurological: Positive for numbness. Negative for dizziness, tremors, seizures, syncope, facial asymmetry, speech difficulty, weakness, light-headedness and headaches.  Hematological: Negative.   Psychiatric/Behavioral: Negative.    Per HPI unless specifically indicated above     Objective:    BP 122/71 (BP Location: Left Arm, Patient Position: Sitting, Cuff Size: Large)   Pulse 61   Temp 98.9 F (37.2 C) (Oral)   Resp 17   Ht 6' 1"  (1.854 m)   Wt (!) 310 lb 8 oz (140.8 kg)   SpO2 100%   BMI 40.97 kg/m   Wt Readings from Last 3 Encounters:  03/01/20 (!) 310  lb 8 oz (140.8 kg)  12/02/19 (!) 330 lb 8 oz (149.9 kg)  05/30/19 (!) 322 lb (146.1 kg)    Physical Exam Vitals and nursing note reviewed.  Constitutional:      General: He is not in acute distress.    Appearance: Normal appearance. He is well-developed and well-groomed. He is morbidly obese. He is not ill-appearing or toxic-appearing.  HENT:     Head: Normocephalic and atraumatic.     Nose:     Comments: Lizbeth Bark is in place, covering mouth and nose. Eyes:     General:        Right eye: No discharge.        Left eye: No discharge.     Extraocular Movements: Extraocular movements intact.     Conjunctiva/sclera: Conjunctivae normal.     Pupils: Pupils are equal, round, and reactive to light.  Cardiovascular:     Rate and Rhythm: Normal rate and regular rhythm.     Pulses: Normal pulses.     Heart sounds: Normal heart sounds. No murmur heard.  No friction rub. No gallop.   Pulmonary:     Effort: Pulmonary effort is normal. No respiratory distress.     Breath sounds: Normal breath sounds.  Musculoskeletal:     Cervical back: Normal.     Thoracic back: Normal.     Lumbar back: No swelling, spasms or tenderness. Normal range of motion. Negative right straight leg  raise test and negative left straight leg raise test.     Right hip: Normal.     Left hip: Normal.     Right upper leg: Normal.     Left upper leg: Normal.     Right knee: Normal.     Left knee: Normal.     Right lower leg: No edema.     Left lower leg: No edema.  Skin:    General: Skin is warm and dry.     Capillary Refill: Capillary refill takes less than 2 seconds.  Neurological:     General: No focal deficit present.     Mental Status: He is alert and oriented to person, place, and time.  Psychiatric:        Attention and Perception: Attention and perception normal.        Mood and Affect: Mood and affect normal.        Speech: Speech normal.        Behavior: Behavior normal. Behavior is cooperative.         Thought Content: Thought content normal.        Cognition and Memory: Cognition and memory normal.    Results for orders placed or performed in visit on 12/02/19  POCT glycosylated hemoglobin (Hb A1C)  Result Value Ref Range   Hemoglobin A1C 5.9 (A) 4.0 - 5.6 %   HbA1c POC (<> result, manual entry)     HbA1c, POC (prediabetic range)     HbA1c, POC (controlled diabetic range)        Assessment & Plan:   Problem List Items Addressed This Visit      Other   Left knee pain    Chronic left knee pain > 1 year, with continued symptoms.  Has not met with Orthopedics or seen an improvement in symptoms.  Will refer to Orthopedics for evaluation.      Relevant Orders   AMB referral to orthopedics   Paresthesia of both feet - Primary    Will have labs drawn for history of decreased WBC and for evaluation of B12, and A1C with parasthesia in feet.  Labs to be drawn today.      Relevant Orders   CBC with Differential   B12   TSH + free T4   HgB A1c   COMPLETE METABOLIC PANEL WITH GFR   Left leg numbness    Likely radicular symptoms from the lower back.  Lower back exam negative in clinic.  With continued symptoms, will refer to Orthopedics, discussed likely needing MRI.  Patient in agreement with plan.      Relevant Orders   AMB referral to orthopedics      No orders of the defined types were placed in this encounter.  Follow up plan: Return if symptoms worsen or fail to improve.   Harlin Rain, [place] Family Nurse Practitioner [facility] 03/01/2020, 1:20 PM

## 2020-03-02 LAB — CBC WITH DIFFERENTIAL/PLATELET
Absolute Monocytes: 438 cells/uL (ref 200–950)
Basophils Absolute: 28 cells/uL (ref 0–200)
Basophils Relative: 0.8 %
Eosinophils Absolute: 158 cells/uL (ref 15–500)
Eosinophils Relative: 4.5 %
HCT: 43.7 % (ref 38.5–50.0)
Hemoglobin: 14.1 g/dL (ref 13.2–17.1)
Lymphs Abs: 1229 cells/uL (ref 850–3900)
MCH: 27.3 pg (ref 27.0–33.0)
MCHC: 32.3 g/dL (ref 32.0–36.0)
MCV: 84.7 fL (ref 80.0–100.0)
MPV: 11.1 fL (ref 7.5–12.5)
Monocytes Relative: 12.5 %
Neutro Abs: 1649 cells/uL (ref 1500–7800)
Neutrophils Relative %: 47.1 %
Platelets: 255 10*3/uL (ref 140–400)
RBC: 5.16 10*6/uL (ref 4.20–5.80)
RDW: 12.8 % (ref 11.0–15.0)
Total Lymphocyte: 35.1 %
WBC: 3.5 10*3/uL — ABNORMAL LOW (ref 3.8–10.8)

## 2020-03-02 LAB — COMPLETE METABOLIC PANEL WITH GFR
AG Ratio: 1.4 (calc) (ref 1.0–2.5)
ALT: 19 U/L (ref 9–46)
AST: 18 U/L (ref 10–40)
Albumin: 4.3 g/dL (ref 3.6–5.1)
Alkaline phosphatase (APISO): 47 U/L (ref 36–130)
BUN: 14 mg/dL (ref 7–25)
CO2: 26 mmol/L (ref 20–32)
Calcium: 9.3 mg/dL (ref 8.6–10.3)
Chloride: 105 mmol/L (ref 98–110)
Creat: 0.99 mg/dL (ref 0.60–1.35)
GFR, Est African American: 116 mL/min/{1.73_m2} (ref >=60)
GFR, Est Non African American: 100 mL/min/{1.73_m2} (ref >=60)
Globulin: 3.1 g/dL (calc) (ref 1.9–3.7)
Glucose, Bld: 84 mg/dL (ref 65–99)
Potassium: 4.2 mmol/L (ref 3.5–5.3)
Sodium: 137 mmol/L (ref 135–146)
Total Bilirubin: 0.5 mg/dL (ref 0.2–1.2)
Total Protein: 7.4 g/dL (ref 6.1–8.1)

## 2020-03-02 LAB — TSH+FREE T4: TSH W/REFLEX TO FT4: 1.04 mIU/L (ref 0.40–4.50)

## 2020-03-02 LAB — HEMOGLOBIN A1C
Hgb A1c MFr Bld: 5.7 % of total Hgb — ABNORMAL HIGH (ref <5.7)
Mean Plasma Glucose: 117 (calc)
eAG (mmol/L): 6.5 (calc)

## 2020-03-02 LAB — VITAMIN B12: Vitamin B-12: 587 pg/mL (ref 200–1100)

## 2020-03-05 DIAGNOSIS — M25572 Pain in left ankle and joints of left foot: Secondary | ICD-10-CM | POA: Diagnosis not present

## 2020-03-05 DIAGNOSIS — M25562 Pain in left knee: Secondary | ICD-10-CM | POA: Diagnosis not present

## 2020-05-25 ENCOUNTER — Ambulatory Visit: Payer: 59 | Admitting: Unknown Physician Specialty

## 2020-06-01 ENCOUNTER — Other Ambulatory Visit: Payer: Self-pay

## 2020-06-01 ENCOUNTER — Ambulatory Visit (INDEPENDENT_AMBULATORY_CARE_PROVIDER_SITE_OTHER): Payer: BC Managed Care – PPO | Admitting: Unknown Physician Specialty

## 2020-06-01 ENCOUNTER — Encounter: Payer: Self-pay | Admitting: Unknown Physician Specialty

## 2020-06-01 VITALS — BP 128/64 | HR 60 | Temp 97.5°F | Resp 17 | Ht 73.0 in | Wt 304.2 lb

## 2020-06-01 DIAGNOSIS — R202 Paresthesia of skin: Secondary | ICD-10-CM | POA: Diagnosis not present

## 2020-06-01 DIAGNOSIS — R7303 Prediabetes: Secondary | ICD-10-CM

## 2020-06-01 DIAGNOSIS — Z6841 Body Mass Index (BMI) 40.0 and over, adult: Secondary | ICD-10-CM

## 2020-06-01 DIAGNOSIS — D72819 Decreased white blood cell count, unspecified: Secondary | ICD-10-CM | POA: Diagnosis not present

## 2020-06-01 LAB — CBC WITH DIFFERENTIAL/PLATELET
Absolute Monocytes: 504 cells/uL (ref 200–950)
Basophils Absolute: 41 cells/uL (ref 0–200)
Basophils Relative: 0.9 %
Eosinophils Absolute: 212 cells/uL (ref 15–500)
Eosinophils Relative: 4.7 %
HCT: 42.2 % (ref 38.5–50.0)
Hemoglobin: 13.9 g/dL (ref 13.2–17.1)
Lymphs Abs: 2021 cells/uL (ref 850–3900)
MCH: 28 pg (ref 27.0–33.0)
MCHC: 32.9 g/dL (ref 32.0–36.0)
MCV: 85.1 fL (ref 80.0–100.0)
MPV: 11.1 fL (ref 7.5–12.5)
Monocytes Relative: 11.2 %
Neutro Abs: 1724 cells/uL (ref 1500–7800)
Neutrophils Relative %: 38.3 %
Platelets: 247 10*3/uL (ref 140–400)
RBC: 4.96 10*6/uL (ref 4.20–5.80)
RDW: 13.8 % (ref 11.0–15.0)
Total Lymphocyte: 44.9 %
WBC: 4.5 10*3/uL (ref 3.8–10.8)

## 2020-06-01 LAB — POCT GLYCOSYLATED HEMOGLOBIN (HGB A1C): Hemoglobin A1C: 5.5 % (ref 4.0–5.6)

## 2020-06-01 NOTE — Assessment & Plan Note (Signed)
Recheck today.  If stable, go to yearly checks

## 2020-06-01 NOTE — Assessment & Plan Note (Signed)
Making progress with lifestyle changes.

## 2020-06-01 NOTE — Assessment & Plan Note (Signed)
Hgb A1C is 5.5%.  Doing well with lifestyle changes

## 2020-06-01 NOTE — Assessment & Plan Note (Signed)
Improved with posture and different shoes

## 2020-06-01 NOTE — Progress Notes (Signed)
BP 128/64 (BP Location: Left Arm, Patient Position: Sitting, Cuff Size: Large)   Pulse 60   Temp (!) 97.5 F (36.4 C) (Temporal)   Resp 17   Ht 6\' 1"  (1.854 m)   Wt (!) 304 lb 3.2 oz (138 kg)   SpO2 99%   BMI 40.13 kg/m    Subjective:    Patient ID: Jamie Ortiz, male    DOB: January 01, 1986, 35 y.o.   MRN: [id number]  HPI: Jamie Ortiz is a 35 y.o. male  Chief Complaint  Patient presents with  . Weight Check    Pt been working on his diet, exercise. He lost 30lbs over the last 5 mths.     Pt with pre diabetes and working on weight management through lifestyle changes.  Last Hgb A1C was 5.7, down from max of 5.9.  He has lost 30 lbs through diet an exercise.  He would prefer not to be on medications at this time.    WBC trends low.  Some improvement last check   Relevant past medical, surgical, family and social history reviewed and updated as indicated. Interim medical history since our last visit reviewed. Allergies and medications reviewed and updated.  Review of Systems  Per HPI unless specifically indicated above     Objective:    BP 128/64 (BP Location: Left Arm, Patient Position: Sitting, Cuff Size: Large)   Pulse 60   Temp (!) 97.5 F (36.4 C) (Temporal)   Resp 17   Ht 6\' 1"  (1.854 m)   Wt (!) 304 lb 3.2 oz (138 kg)   SpO2 99%   BMI 40.13 kg/m   Wt Readings from Last 3 Encounters:  06/01/20 (!) 304 lb 3.2 oz (138 kg)  03/01/20 (!) 310 lb 8 oz (140.8 kg)  12/02/19 (!) 330 lb 8 oz (149.9 kg)    Physical Exam Constitutional:      General: He is not in acute distress.    Appearance: Normal appearance. He is well-developed and well-nourished.  HENT:     Head: Normocephalic and atraumatic.  Eyes:     General: Lids are normal. No scleral icterus.       Right eye: No discharge.        Left eye: No discharge.     Conjunctiva/sclera: Conjunctivae normal.  Neck:     Vascular: No carotid bruit or JVD.  Cardiovascular:     Rate and Rhythm: Normal rate and  regular rhythm.     Heart sounds: Normal heart sounds.  Pulmonary:     Effort: Pulmonary effort is normal. No respiratory distress.     Breath sounds: Normal breath sounds.  Abdominal:     Palpations: There is no hepatomegaly or splenomegaly.  Musculoskeletal:        General: Normal range of motion.     Cervical back: Normal range of motion and neck supple.  Skin:    General: Skin is warm, dry and intact.     Coloration: Skin is not pale.     Findings: No rash.  Neurological:     Mental Status: He is alert and oriented to person, place, and time.  Psychiatric:        Mood and Affect: Mood and affect normal.        Behavior: Behavior normal.        Thought Content: Thought content normal.        Judgment: Judgment normal.     Results for orders placed or performed in visit  on 06/01/20  POCT glycosylated hemoglobin (Hb A1C)  Result Value Ref Range   Hemoglobin A1C 5.5 4.0 - 5.6 %   HbA1c POC (<> result, manual entry)     HbA1c, POC (prediabetic range)     HbA1c, POC (controlled diabetic range)        Assessment & Plan:   Problem List Items Addressed This Visit      Unprioritized   BMI 40.0-44.9, adult [facility])    Making progress with lifestyle changes.        Leukopenia    Recheck today.  If stable, go to yearly checks      Relevant Orders   CBC with Differential/Platelet   Paresthesia of both feet    Improved with posture and different shoes      Prediabetes - Primary    Hgb A1C is 5.5%.  Doing well with lifestyle changes      Relevant Orders   POCT glycosylated hemoglobin (Hb A1C) (Completed)       Follow up plan: Return in about 6 months (around 12/02/2020), or physical. for physical

## 2020-06-03 DIAGNOSIS — M9903 Segmental and somatic dysfunction of lumbar region: Secondary | ICD-10-CM | POA: Diagnosis not present

## 2020-06-03 DIAGNOSIS — M5441 Lumbago with sciatica, right side: Secondary | ICD-10-CM | POA: Diagnosis not present

## 2020-06-03 DIAGNOSIS — M9905 Segmental and somatic dysfunction of pelvic region: Secondary | ICD-10-CM | POA: Diagnosis not present

## 2020-06-03 DIAGNOSIS — M955 Acquired deformity of pelvis: Secondary | ICD-10-CM | POA: Diagnosis not present

## 2020-06-08 ENCOUNTER — Ambulatory Visit: Payer: 59 | Admitting: Family Medicine

## 2020-11-04 ENCOUNTER — Ambulatory Visit (INDEPENDENT_AMBULATORY_CARE_PROVIDER_SITE_OTHER): Payer: BC Managed Care – PPO | Admitting: Internal Medicine

## 2020-11-04 ENCOUNTER — Other Ambulatory Visit: Payer: Self-pay

## 2020-11-04 ENCOUNTER — Encounter: Payer: Self-pay | Admitting: Internal Medicine

## 2020-11-04 VITALS — BP 128/58 | HR 56 | Temp 97.5°F | Resp 17 | Ht 73.0 in | Wt 305.8 lb

## 2020-11-04 DIAGNOSIS — E7801 Familial hypercholesterolemia: Secondary | ICD-10-CM | POA: Diagnosis not present

## 2020-11-04 DIAGNOSIS — R7303 Prediabetes: Secondary | ICD-10-CM

## 2020-11-04 DIAGNOSIS — Z1159 Encounter for screening for other viral diseases: Secondary | ICD-10-CM

## 2020-11-04 DIAGNOSIS — Z0001 Encounter for general adult medical examination with abnormal findings: Secondary | ICD-10-CM

## 2020-11-04 DIAGNOSIS — D700 Congenital agranulocytosis: Secondary | ICD-10-CM | POA: Diagnosis not present

## 2020-11-04 NOTE — Patient Instructions (Signed)
Health Maintenance, Male Adopting a healthy lifestyle and getting preventive care are important in promoting health and wellness. Ask your health care provider about: The right schedule for you to have regular tests and exams. Things you can do on your own to prevent diseases and keep yourself healthy. What should I know about diet, weight, and exercise? Eat a healthy diet  Eat a diet that includes plenty of vegetables, fruits, low-fat dairy products, and lean protein. Do not eat a lot of foods that are high in solid fats, added sugars, or sodium.  Maintain a healthy weight Body mass index (BMI) is a measurement that can be used to identify possible weight problems. It estimates body fat based on height and weight. Your health care provider can help determine your BMI and help you achieve or maintain ahealthy weight. Get regular exercise Get regular exercise. This is one of the most important things you can do for your health. Most adults should: Exercise for at least 150 minutes each week. The exercise should increase your heart rate and make you sweat (moderate-intensity exercise). Do strengthening exercises at least twice a week. This is in addition to the moderate-intensity exercise. Spend less time sitting. Even light physical activity can be beneficial. Watch cholesterol and blood lipids Have your blood tested for lipids and cholesterol at 33 years of age, then havethis test every 5 years. You may need to have your cholesterol levels checked more often if: Your lipid or cholesterol levels are high. You are older than 33 years of age. You are at high risk for heart disease. What should I know about cancer screening? Many types of cancers can be detected early and may often be prevented. Depending on your health history and family history, you may need to have cancer screening at various ages. This may include screening for: Colorectal cancer. Prostate cancer. Skin cancer. Lung  cancer. What should I know about heart disease, diabetes, and high blood pressure? Blood pressure and heart disease High blood pressure causes heart disease and increases the risk of stroke. This is more likely to develop in people who have high blood pressure readings, are of African descent, or are overweight. Talk with your health care provider about your target blood pressure readings. Have your blood pressure checked: Every 3-5 years if you are 18-39 years of age. Every year if you are 40 years old or older. If you are between the ages of 65 and 75 and are a current or former smoker, ask your health care provider if you should have a one-time screening for abdominal aortic aneurysm (AAA). Diabetes Have regular diabetes screenings. This checks your fasting blood sugar level. Have the screening done: Once every three years after age 45 if you are at a normal weight and have a low risk for diabetes. More often and at a younger age if you are overweight or have a high risk for diabetes. What should I know about preventing infection? Hepatitis B If you have a higher risk for hepatitis B, you should be screened for this virus. Talk with your health care provider to find out if you are at risk forhepatitis B infection. Hepatitis C Blood testing is recommended for: Everyone born from 1945 through 1965. Anyone with known risk factors for hepatitis C. Sexually transmitted infections (STIs) You should be screened each year for STIs, including gonorrhea and chlamydia, if: You are sexually active and are younger than 33 years of age. You are older than 33 years of age   and your health care provider tells you that you are at risk for this type of infection. Your sexual activity has changed since you were last screened, and you are at increased risk for chlamydia or gonorrhea. Ask your health care provider if you are at risk. Ask your health care provider about whether you are at high risk for HIV.  Your health care provider may recommend a prescription medicine to help prevent HIV infection. If you choose to take medicine to prevent HIV, you should first get tested for HIV. You should then be tested every 3 months for as long as you are taking the medicine. Follow these instructions at home: Lifestyle Do not use any products that contain nicotine or tobacco, such as cigarettes, e-cigarettes, and chewing tobacco. If you need help quitting, ask your health care provider. Do not use street drugs. Do not share needles. Ask your health care provider for help if you need support or information about quitting drugs. Alcohol use Do not drink alcohol if your health care provider tells you not to drink. If you drink alcohol: Limit how much you have to 0-2 drinks a day. Be aware of how much alcohol is in your drink. In the [place] one drink equals one 12 oz bottle of beer (355 mL), one 5 oz glass of wine (148 mL), or one 1 oz glass of hard liquor (44 mL). General instructions Schedule regular health, dental, and eye exams. Stay current with your vaccines. Tell your health care provider if: You often feel depressed. You have ever been abused or do not feel safe at home. Summary Adopting a healthy lifestyle and getting preventive care are important in promoting health and wellness. Follow your health care provider's instructions about healthy diet, exercising, and getting tested or screened for diseases. Follow your health care provider's instructions on monitoring your cholesterol and blood pressure. This information is not intended to replace advice given to you by your health care provider. Make sure you discuss any questions you have with your healthcare provider. Document Revised: 03/13/2018 Document Reviewed: 03/13/2018 Elsevier Patient Education  2022 [employer]  

## 2020-11-04 NOTE — Progress Notes (Signed)
Subjective:    Patient ID: Jamie Ortiz, male    DOB: [birth year], 35 y.o.   MRN: [id number]  HPI  Patient presents the clinic today for his annual exam.  He is also due to follow-up chronic conditions.  Neutropenia: Congenital.  His last WBC was 3.5% 06/2020.  He is not following with hematology.  Prediabetes: His last A1c was 5.5%, 06/2020.  He is not currently checking his sugars.  He is not taking any oral diabetic medication.  HLD: His last LDL was 122, triglycerides 96, 06/2019.  He is not taking any cholesterol-lowering medication at this time.  He tries to consume a low-fat diet.  Flu: Never Tetanus: 12/2016 COVID: Never Dentist: Annually  Diet: He does eat meat.  He consumes fruits and vegetables.  He occasionally eats fried food.  He drinks mostly water.  Review of Systems     Past Medical History:  Diagnosis Date   Morbid obesity (HCC)     Current Outpatient Medications  Medication Sig Dispense Refill   Multiple Vitamins-Minerals (MENS MULTI VITAMIN & MINERAL PO) Take by mouth.     No current facility-administered medications for this visit.    No Known Allergies  Family History  Problem Relation Age of Onset   Hypertension Mother    Cancer Father        lung, prostate   Healthy Sister     Social History   Socioeconomic History   Marital status: Single    Spouse name: Not on file   Number of children: Not on file   Years of education: Not on file   Highest education level: High school graduate  Occupational History   Not on file  Tobacco Use   Smoking status: Never   Smokeless tobacco: Never  Vaping Use   Vaping Use: Never used  Substance and Sexual Activity   Alcohol use: No   Drug use: No   Sexual activity: Yes    Birth control/protection: None  Other Topics Concern   Not on file  Social History Narrative   Not on file   Social Determinants of Health   Financial Resource Strain: Not on file  Food Insecurity: Not on file   Transportation Needs: Not on file  Physical Activity: Not on file  Stress: Not on file  Social Connections: Not on file  Intimate Partner Violence: Not on file     Constitutional: Denies fever, malaise, fatigue, headache or abrupt weight changes.  HEENT: Denies eye pain, eye redness, ear pain, ringing in the ears, wax buildup, runny nose, nasal congestion, bloody nose, or sore throat. Respiratory: Denies difficulty breathing, shortness of breath, cough or sputum production.   Cardiovascular: Denies chest pain, chest tightness, palpitations or swelling in the hands or feet.  Gastrointestinal: Denies abdominal pain, bloating, constipation, diarrhea or blood in the stool.  GU: Denies urgency, frequency, pain with urination, burning sensation, blood in urine, odor or discharge. Musculoskeletal: Denies decrease in range of motion, difficulty with gait, muscle pain or joint pain and swelling.  Skin: Denies redness, rashes, lesions or ulcercations.  Neurological: Denies dizziness, difficulty with memory, difficulty with speech or problems with balance and coordination.  Psych: Denies anxiety, depression, SI/HI.  No other specific complaints in a complete review of systems (except as listed in HPI above).  Objective:   Physical Exam   BP (!) 128/58 (BP Location: Left Arm, Patient Position: Sitting, Cuff Size: Large)   Pulse (!) 56   Temp (!) 97.5 F (36.4  C) (Temporal)   Resp 17   Ht $R'6\' 1"'UM$  (1.854 m)   Wt (!) 305 lb 12.8 oz (138.7 kg)   SpO2 99%   BMI 40.35 kg/m  Wt Readings from Last 3 Encounters:  11/04/20 (!) 305 lb 12.8 oz (138.7 kg)  06/01/20 (!) 304 lb 3.2 oz (138 kg)  03/01/20 (!) 310 lb 8 oz (140.8 kg)    General: Appears his stated age, obese, in NAD. Skin: Warm, dry and intact. No rashes noted. HEENT: Head: normal shape and size; Eyes: sclera white and EOMs intact;  Neck:  Neck supple, trachea midline. No masses, lumps or thyromegaly present.  Cardiovascular: Normal  rate and rhythm. S1,S2 noted.  No murmur, rubs or gallops noted. No JVD or BLE edema.  Pulmonary/Chest: Normal effort and positive vesicular breath sounds. No respiratory distress. No wheezes, rales or ronchi noted.  Abdomen: Soft and nontender. Normal bowel sounds. No distention or masses noted. Liver, spleen and kidneys non palpable. Musculoskeletal: Strength 5/5 BUE/BLE.  No difficulty with gait.  Neurological: Alert and oriented. Cranial nerves II-XII grossly intact. Coordination normal.  Psychiatric: Mood and affect normal. Behavior is normal. Judgment and thought content normal.     BMET    Component Value Date/Time   NA 137 03/01/2020 1037   NA 141 05/31/2018 1142   K 4.2 03/01/2020 1037   CL 105 03/01/2020 1037   CO2 26 03/01/2020 1037   GLUCOSE 84 03/01/2020 1037   BUN 14 03/01/2020 1037   BUN 10 05/31/2018 1142   CREATININE 0.99 03/01/2020 1037   CALCIUM 9.3 03/01/2020 1037   GFRNONAA 100 03/01/2020 1037   GFRAA 116 03/01/2020 1037    Lipid Panel     Component Value Date/Time   CHOL 187 06/02/2019 0925   CHOL 177 05/31/2018 1142   TRIG 96 06/02/2019 0925   HDL 45 06/02/2019 0925   HDL 42 05/31/2018 1142   CHOLHDL 4.2 06/02/2019 0925   LDLCALC 122 (H) 06/02/2019 0925    CBC    Component Value Date/Time   WBC 4.5 06/01/2020 0852   RBC 4.96 06/01/2020 0852   HGB 13.9 06/01/2020 0852   HGB 14.3 05/31/2018 1142   HCT 42.2 06/01/2020 0852   HCT 43.5 05/31/2018 1142   PLT 247 06/01/2020 0852   PLT 255 05/31/2018 1142   MCV 85.1 06/01/2020 0852   MCV 85 05/31/2018 1142   MCH 28.0 06/01/2020 0852   MCHC 32.9 06/01/2020 0852   RDW 13.8 06/01/2020 0852   RDW 13.4 05/31/2018 1142   LYMPHSABS 2,021 06/01/2020 0852   LYMPHSABS 1.1 05/31/2018 1142   EOSABS 212 06/01/2020 0852   EOSABS 0.1 05/31/2018 1142   BASOSABS 41 06/01/2020 0852   BASOSABS 0.0 05/31/2018 1142    Hgb A1C Lab Results  Component Value Date   HGBA1C 5.5 06/01/2020            Assessment & Plan:   Preventative Health Maintenance:  Encouraged him to get a flu shot in the fall Tetanus UTD Encouraged him to get his COVID-vaccine Encouraged him to consume a balanced diet and exercise regimen Advised him to see an eye doctor and dentist annually We will check CBC, c-Met, TSH, lipid, A1c and hep C today  RTC in 1 year, sooner if needed Webb Silversmith, NP This visit occurred during the SARS-CoV-2 public health emergency.  Safety protocols were in place, including screening questions prior to the visit, additional usage of staff PPE, and extensive cleaning of exam  room while observing appropriate contact time as indicated for disinfecting solutions.

## 2020-11-05 DIAGNOSIS — E66813 Obesity, class 3: Secondary | ICD-10-CM | POA: Insufficient documentation

## 2020-11-05 DIAGNOSIS — E78019 Familial hypercholesterolemia, unspecified: Secondary | ICD-10-CM | POA: Insufficient documentation

## 2020-11-05 DIAGNOSIS — E6609 Other obesity due to excess calories: Secondary | ICD-10-CM | POA: Insufficient documentation

## 2020-11-05 DIAGNOSIS — E7801 Familial hypercholesterolemia: Secondary | ICD-10-CM | POA: Insufficient documentation

## 2020-11-05 DIAGNOSIS — D700 Congenital agranulocytosis: Secondary | ICD-10-CM | POA: Insufficient documentation

## 2020-11-05 LAB — COMPLETE METABOLIC PANEL WITH GFR
AG Ratio: 1.4 (calc) (ref 1.0–2.5)
ALT: 16 U/L (ref 9–46)
AST: 16 U/L (ref 10–40)
Albumin: 4.2 g/dL (ref 3.6–5.1)
Alkaline phosphatase (APISO): 47 U/L (ref 36–130)
BUN: 14 mg/dL (ref 7–25)
CO2: 27 mmol/L (ref 20–32)
Calcium: 9.2 mg/dL (ref 8.6–10.3)
Chloride: 104 mmol/L (ref 98–110)
Creat: 1.07 mg/dL (ref 0.60–1.26)
Globulin: 2.9 g/dL (calc) (ref 1.9–3.7)
Glucose, Bld: 85 mg/dL (ref 65–99)
Potassium: 4.1 mmol/L (ref 3.5–5.3)
Sodium: 138 mmol/L (ref 135–146)
Total Bilirubin: 0.5 mg/dL (ref 0.2–1.2)
Total Protein: 7.1 g/dL (ref 6.1–8.1)
eGFR: 94 mL/min/{1.73_m2} (ref >=60)

## 2020-11-05 LAB — CBC
HCT: 43.3 % (ref 38.5–50.0)
Hemoglobin: 13.8 g/dL (ref 13.2–17.1)
MCH: 27.5 pg (ref 27.0–33.0)
MCHC: 31.9 g/dL — ABNORMAL LOW (ref 32.0–36.0)
MCV: 86.4 fL (ref 80.0–100.0)
MPV: 10.7 fL (ref 7.5–12.5)
Platelets: 229 10*3/uL (ref 140–400)
RBC: 5.01 10*6/uL (ref 4.20–5.80)
RDW: 13.6 % (ref 11.0–15.0)
WBC: 3.4 10*3/uL — ABNORMAL LOW (ref 3.8–10.8)

## 2020-11-05 LAB — LIPID PANEL
Cholesterol: 190 mg/dL (ref <200)
HDL: 47 mg/dL (ref >=40)
LDL Cholesterol (Calc): 118 mg/dL (calc) — ABNORMAL HIGH
Non-HDL Cholesterol (Calc): 143 mg/dL (calc) — ABNORMAL HIGH (ref <130)
Total CHOL/HDL Ratio: 4 (calc) (ref <5.0)
Triglycerides: 140 mg/dL (ref <150)

## 2020-11-05 LAB — TSH: TSH: 0.95 mIU/L (ref 0.40–4.50)

## 2020-11-05 LAB — HEPATITIS C ANTIBODY
Hepatitis C Ab: NONREACTIVE
SIGNAL TO CUT-OFF: 0.01 (ref <1.00)

## 2020-11-05 LAB — HEMOGLOBIN A1C
Hgb A1c MFr Bld: 5.5 % of total Hgb (ref <5.7)
Mean Plasma Glucose: 111 mg/dL
eAG (mmol/L): 6.2 mmol/L

## 2020-11-05 NOTE — Assessment & Plan Note (Signed)
Encourage diet and exercise for weight loss 

## 2020-11-05 NOTE — Assessment & Plan Note (Signed)
A1c today Encourage low-carb diet and exercise for weight loss 

## 2020-11-05 NOTE — Assessment & Plan Note (Signed)
C-Met and lipid profile today Encouraged him to consume a low-fat diet 

## 2020-11-05 NOTE — Assessment & Plan Note (Signed)
CBC today.  

## 2020-11-16 DIAGNOSIS — M5441 Lumbago with sciatica, right side: Secondary | ICD-10-CM | POA: Diagnosis not present

## 2020-11-16 DIAGNOSIS — M9905 Segmental and somatic dysfunction of pelvic region: Secondary | ICD-10-CM | POA: Diagnosis not present

## 2020-11-16 DIAGNOSIS — M9903 Segmental and somatic dysfunction of lumbar region: Secondary | ICD-10-CM | POA: Diagnosis not present

## 2020-11-16 DIAGNOSIS — M955 Acquired deformity of pelvis: Secondary | ICD-10-CM | POA: Diagnosis not present

## 2020-12-14 ENCOUNTER — Encounter: Payer: 59 | Admitting: Unknown Physician Specialty

## 2020-12-23 ENCOUNTER — Other Ambulatory Visit: Payer: Self-pay

## 2020-12-23 ENCOUNTER — Ambulatory Visit
Admission: EM | Admit: 2020-12-23 | Discharge: 2020-12-23 | Disposition: A | Discharge status: Home / Self Care | Payer: 59 | Attending: Emergency Medicine | Admitting: Emergency Medicine

## 2020-12-23 DIAGNOSIS — H1031 Unspecified acute conjunctivitis, right eye: Secondary | ICD-10-CM

## 2020-12-23 MED ORDER — ERYTHROMYCIN 5 MG/GM OP OINT: TOPICAL_OINTMENT | OPHTHALMIC | 0 refills | Status: DC

## 2020-12-23 NOTE — ED Triage Notes (Signed)
Patient c/o complains of right eye infection , son also had eye infection and he thinks that's where he got it from. He started using erythromycin ophthalmic ointment. Reports it started yesterday. -MB

## 2020-12-23 NOTE — Discharge Instructions (Addendum)
Continue use erythromycin ointment four times a day for 7 days  Today you being treated for bacterial conjunctivitis.   Place one drop of polytrim into the effected eye every 4 hours while awake for 7 days. If the other eye starts to have symptoms you may use medication in it as well. Do not allow tip of dropper to touch eye. May use cool compress for comfort and to remove discharge if present. Pat the eye, do not wipe.  Do not rub eyes, this may cause more irritation.  May use benadryl as needed to help if itching present.  Please avoid use of eye makeup until symptoms clear.  If symptoms persist after use of medication, please follow up at Urgent Care or with ophthalmologist (eye doctor)

## 2020-12-23 NOTE — ED Provider Notes (Signed)
[facility]    CSN: [id number] Arrival date & time: 12/23/20  1207      History   Chief Complaint Chief Complaint  Patient presents with  . Eye Problem    HPI Jamie Ortiz is a 35 y.o. male.   Patient presents with right eye redness for 1 day.  Denies itching, discharge, photosensitivity, blurred vision, floaters.  Son had conjunctivitis within the last week, patient feels he got symptoms from child.  Started using erythromycin ointment yesterday with some improvement.  Job requiring work note.  Past Medical History:  Diagnosis Date  . Morbid obesity [facility])     Patient Active Problem List   Diagnosis Date Noted  . Congenital neutropenia (HCC) 11/05/2020  . Familial hypercholesterolemia 11/05/2020  . Morbid obesity (HCC) 11/05/2020  . Prediabetes 05/30/2019    Past Surgical History:  Procedure Laterality Date  . NO PAST SURGERIES         Home Medications    Prior to Admission medications   Medication Sig Start Date End Date Taking? Authorizing Provider  erythromycin ophthalmic ointment Place a 1/2 inch ribbon of ointment into the lower eyelid. 12/23/20  Yes Kamariyah Timberlake, Elita Boone, NP  Multiple Vitamins-Minerals (MENS MULTI VITAMIN & MINERAL PO) Take by mouth.    [provider]    Family History Family History  Problem Relation Age of Onset  . Hypertension Mother   . Cancer Father        lung, prostate  . Healthy Sister     Social History Social History   Tobacco Use  . Smoking status: Never  . Smokeless tobacco: Never  Vaping Use  . Vaping Use: Never used  Substance Use Topics  . Alcohol use: No  . Drug use: No     Allergies   Patient has no known allergies.   Review of Systems Review of Systems  Constitutional: Negative.   HENT: Negative.    Eyes:  Positive for redness. Negative for photophobia, pain, discharge, itching and visual disturbance.  Respiratory: Negative.    Cardiovascular: Negative.   Neurological:  Negative.     Physical Exam Triage Vital Signs ED Triage Vitals  Enc Vitals Group     BP 12/23/20 1240 133/87     Pulse Rate 12/23/20 1240 83     Resp 12/23/20 1240 18     Temp 12/23/20 1240 99.6 F (37.6 C)     Temp Source 12/23/20 1240 Oral     SpO2 12/23/20 1240 92 %     Weight 12/23/20 1238 (!) 305 lb (138.3 kg)     Height 12/23/20 1238 6\' 2"  (1.88 m)     Head Circumference --      Peak Flow --      Pain Score 12/23/20 1238 0     Pain Loc --      Pain Edu? --      Excl. in GC? --    No data found.  Updated Vital Signs BP 133/87 (BP Location: Left Arm)   Pulse 83   Temp 99.6 F (37.6 C) (Oral)   Resp 18   Ht 6\' 2"  (1.88 m)   Wt (!) 305 lb (138.3 kg)   SpO2 92%   BMI 39.16 kg/m   Visual Acuity Right Eye Distance: 20/40 uncorrected Left Eye Distance: 20/40 uncorrected Bilateral Distance: 20/40 uncorrected  Right Eye Near:   Left Eye Near:    Bilateral Near:     Physical Exam Constitutional:  Appearance: Normal appearance. He is normal weight.  HENT:     Head: Normocephalic.  Eyes:     Comments: Diffuse erythema throughout the right sclera, no exudate or discharge noted, vision grossly intact, extraocular movements intact  Pulmonary:     Effort: Pulmonary effort is normal.  Skin:    General: Skin is warm and dry.  Neurological:     Mental Status: He is alert and oriented to person, place, and time. Mental status is at baseline.  Psychiatric:        Mood and Affect: Mood normal.        Behavior: Behavior normal.     UC Treatments / Results  Labs (all labs ordered are listed, but only abnormal results are displayed) Labs Reviewed - No data to display  EKG   Radiology No results found.  Procedures Procedures (including critical care time)  Medications Ordered in UC Medications - No data to display  Initial Impression / Assessment and Plan / UC Course  I have reviewed the triage vital signs and the nursing notes.  Pertinent labs &  imaging results that were available during my care of the patient were reviewed by me and considered in my medical decision making (see chart for details).  Acute bacterial conjunctivitis of right eye  1.  Erythromycin ophthalmic ointment would have pain 4 times a day for 7 days 2.  Given work note to return tomorrow 3.  Advised follow-up with ophthalmologist for persistent or worsening symptoms Final Clinical Impressions(s) / UC Diagnoses   Final diagnoses:  Acute bacterial conjunctivitis of right eye     Discharge Instructions      Continue use erythromycin ointment four times a day for 7 days  Today you being treated for bacterial conjunctivitis.   Place one drop of polytrim into the effected eye every 4 hours while awake for 7 days. If the other eye starts to have symptoms you may use medication in it as well. Do not allow tip of dropper to touch eye. May use cool compress for comfort and to remove discharge if present. Pat the eye, do not wipe.  Do not rub eyes, this may cause more irritation.  May use benadryl as needed to help if itching present.  Please avoid use of eye makeup until symptoms clear.  If symptoms persist after use of medication, please follow up at Urgent Care or with ophthalmologist (eye doctor)    ED Prescriptions     Medication Sig Dispense Auth. Provider   erythromycin ophthalmic ointment Place a 1/2 inch ribbon of ointment into the lower eyelid. 3.5 g Valinda Hoar, NP      PDMP not reviewed this encounter.   Valinda Hoar, [place] 12/23/20 [phone]

## 2021-01-10 ENCOUNTER — Ambulatory Visit: Payer: Self-pay

## 2021-01-10 ENCOUNTER — Other Ambulatory Visit: Payer: Self-pay

## 2021-01-10 ENCOUNTER — Other Ambulatory Visit: Payer: Self-pay | Admitting: Occupational Medicine

## 2021-01-10 DIAGNOSIS — Z0000 Encounter for general adult medical examination without abnormal findings: Secondary | ICD-10-CM

## 2021-03-01 ENCOUNTER — Telehealth: Payer: Self-pay

## 2021-03-01 ENCOUNTER — Ambulatory Visit: Payer: Self-pay

## 2021-03-01 NOTE — Telephone Encounter (Signed)
Summary: knee discomfort   The patient has been experiencing knee discomfort for the past few months but recently noticed a significant increase in the discomfort   The patient would like to discuss this further when possible   Please contact when available    Called pt and LM to call back to discuss issue. Call back number provided.

## 2021-03-01 NOTE — Telephone Encounter (Signed)
I called and offered the patient an appt to come in and address his persistent Left knee pain. He declined scheduling an appointment at this time. 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

## 2021-03-01 NOTE — Telephone Encounter (Signed)
Copied from CRM [id number]. Topic: General - Other >> Mar 01, 2021 10:44 AM Jamie Ortiz A wrote: Reason for CRM: the patient's wife has called to request orders for an x ray of their left knee  The patient has experienced knee pain for a mew months previously but noticed an increase in the discomfort within the past few days  Please contact further when possible

## 2021-03-02 NOTE — Telephone Encounter (Signed)
The practice has talked with pt. And offered an appointment. He declined at this time.

## 2021-10-18 ENCOUNTER — Ambulatory Visit: Payer: BC Managed Care – PPO | Admitting: Internal Medicine

## 2021-10-18 NOTE — Progress Notes (Deleted)
Subjective:    Patient ID: Jamie Ortiz, male    DOB: 1985/11/16, 36 y.o.   MRN: [id number]  HPI  Patient presents to clinic today for follow-up of chronic conditions.  Neutropenia: His last WBC count was 3.4, 11/2020.  He is not following with hematology.  Prediabetes: His last A1c was 5.5%, 11/2020.  He is not  taking any oral diabetic medication.  He does not check his sugars.  HLD: His last LDL was 118, triglycerides 140, 11/2020.  He is not taking any cholesterol-lowering medication at this time.  He tries to consume a low-fat diet.  Review of Systems     Past Medical History:  Diagnosis Date   Morbid obesity (HCC)     Current Outpatient Medications  Medication Sig Dispense Refill   erythromycin ophthalmic ointment Place a 1/2 inch ribbon of ointment into the lower eyelid. 3.5 g 0   Multiple Vitamins-Minerals (MENS MULTI VITAMIN & MINERAL PO) Take by mouth.     No current facility-administered medications for this visit.    No Known Allergies  Family History  Problem Relation Age of Onset   Hypertension Mother    Cancer Father        lung, prostate   Healthy Sister     Social History   Socioeconomic History   Marital status: Single    Spouse name: Not on file   Number of children: Not on file   Years of education: Not on file   Highest education level: High school graduate  Occupational History   Not on file  Tobacco Use   Smoking status: Never   Smokeless tobacco: Never  Vaping Use   Vaping Use: Never used  Substance and Sexual Activity   Alcohol use: No   Drug use: No   Sexual activity: Yes    Birth control/protection: None  Other Topics Concern   Not on file  Social History Narrative   Not on file   Social Determinants of Health   Financial Resource Strain: Not on file  Food Insecurity: Not on file  Transportation Needs: Not on file  Physical Activity: Not on file  Stress: Not on file  Social Connections: Not on file  Intimate Partner  Violence: Not on file     Constitutional: Denies fever, malaise, fatigue, headache or abrupt weight changes.  HEENT: Denies eye pain, eye redness, ear pain, ringing in the ears, wax buildup, runny nose, nasal congestion, bloody nose, or sore throat. Respiratory: Denies difficulty breathing, shortness of breath, cough or sputum production.   Cardiovascular: Denies chest pain, chest tightness, palpitations or swelling in the hands or feet.  Gastrointestinal: Denies abdominal pain, bloating, constipation, diarrhea or blood in the stool.  GU: Denies urgency, frequency, pain with urination, burning sensation, blood in urine, odor or discharge. Musculoskeletal: Denies decrease in range of motion, difficulty with gait, muscle pain or joint pain and swelling.  Skin: Denies redness, rashes, lesions or ulcercations.  Neurological: Denies dizziness, difficulty with memory, difficulty with speech or problems with balance and coordination.  Psych: Denies anxiety, depression, SI/HI.  No other specific complaints in a complete review of systems (except as listed in HPI above).  Objective:   Physical Exam    There were no vitals taken for this visit. Wt Readings from Last 3 Encounters:  12/23/20 (!) 305 lb (138.3 kg)  11/04/20 (!) 305 lb 12.8 oz (138.7 kg)  06/01/20 (!) 304 lb 3.2 oz (138 kg)    General: Appears their stated  age, well developed, well nourished in NAD. Skin: Warm, dry and intact. No rashes, lesions or ulcerations noted. HEENT: Head: normal shape and size; Eyes: sclera white, no icterus, conjunctiva pink, PERRLA and EOMs intact; Ears: Tm's gray and intact, normal light reflex; Nose: mucosa pink and moist, septum midline; Throat/Mouth: Teeth present, mucosa pink and moist, no exudate, lesions or ulcerations noted.  Neck:  Neck supple, trachea midline. No masses, lumps or thyromegaly present.  Cardiovascular: Normal rate and rhythm. S1,S2 noted.  No murmur, rubs or gallops noted. No JVD  or BLE edema. No carotid bruits noted. Pulmonary/Chest: Normal effort and positive vesicular breath sounds. No respiratory distress. No wheezes, rales or ronchi noted.  Abdomen: Soft and nontender. Normal bowel sounds. No distention or masses noted. Liver, spleen and kidneys non palpable. Musculoskeletal: Normal range of motion. No signs of joint swelling. No difficulty with gait.  Neurological: Alert and oriented. Cranial nerves II-XII grossly intact. Coordination normal.  Psychiatric: Mood and affect normal. Behavior is normal. Judgment and thought content normal.    BMET    Component Value Date/Time   NA 138 11/04/2020 1119   NA 141 05/31/2018 1142   K 4.1 11/04/2020 1119   CL 104 11/04/2020 1119   CO2 27 11/04/2020 1119   GLUCOSE 85 11/04/2020 1119   BUN 14 11/04/2020 1119   BUN 10 05/31/2018 1142   CREATININE 1.07 11/04/2020 1119   CALCIUM 9.2 11/04/2020 1119   GFRNONAA 100 03/01/2020 1037   GFRAA 116 03/01/2020 1037    Lipid Panel     Component Value Date/Time   CHOL 190 11/04/2020 1119   CHOL 177 05/31/2018 1142   TRIG 140 11/04/2020 1119   HDL 47 11/04/2020 1119   HDL 42 05/31/2018 1142   CHOLHDL 4.0 11/04/2020 1119   LDLCALC 118 (H) 11/04/2020 1119    CBC    Component Value Date/Time   WBC 3.4 (L) 11/04/2020 1119   RBC 5.01 11/04/2020 1119   HGB 13.8 11/04/2020 1119   HGB 14.3 05/31/2018 1142   HCT 43.3 11/04/2020 1119   HCT 43.5 05/31/2018 1142   PLT 229 11/04/2020 1119   PLT 255 05/31/2018 1142   MCV 86.4 11/04/2020 1119   MCV 85 05/31/2018 1142   MCH 27.5 11/04/2020 1119   MCHC 31.9 (L) 11/04/2020 1119   RDW 13.6 11/04/2020 1119   RDW 13.4 05/31/2018 1142   LYMPHSABS 2,021 06/01/2020 0852   LYMPHSABS 1.1 05/31/2018 1142   EOSABS 212 06/01/2020 0852   EOSABS 0.1 05/31/2018 1142   BASOSABS 41 06/01/2020 0852   BASOSABS 0.0 05/31/2018 1142    Hgb A1C Lab Results  Component Value Date   HGBA1C 5.5 11/04/2020          Assessment & Plan:     RTC in 6 months, for your annual exam Nicki Reaper, NP

## 2021-11-21 ENCOUNTER — Encounter: Payer: Self-pay | Admitting: Internal Medicine

## 2021-12-13 IMAGING — DX DG CHEST 1V
1 series · 1 of 1 positions shown · non-contrast
Comparison: None.

CLINICAL DATA: Physical exam

EXAM:
CHEST  1 VIEW

[chest pa]
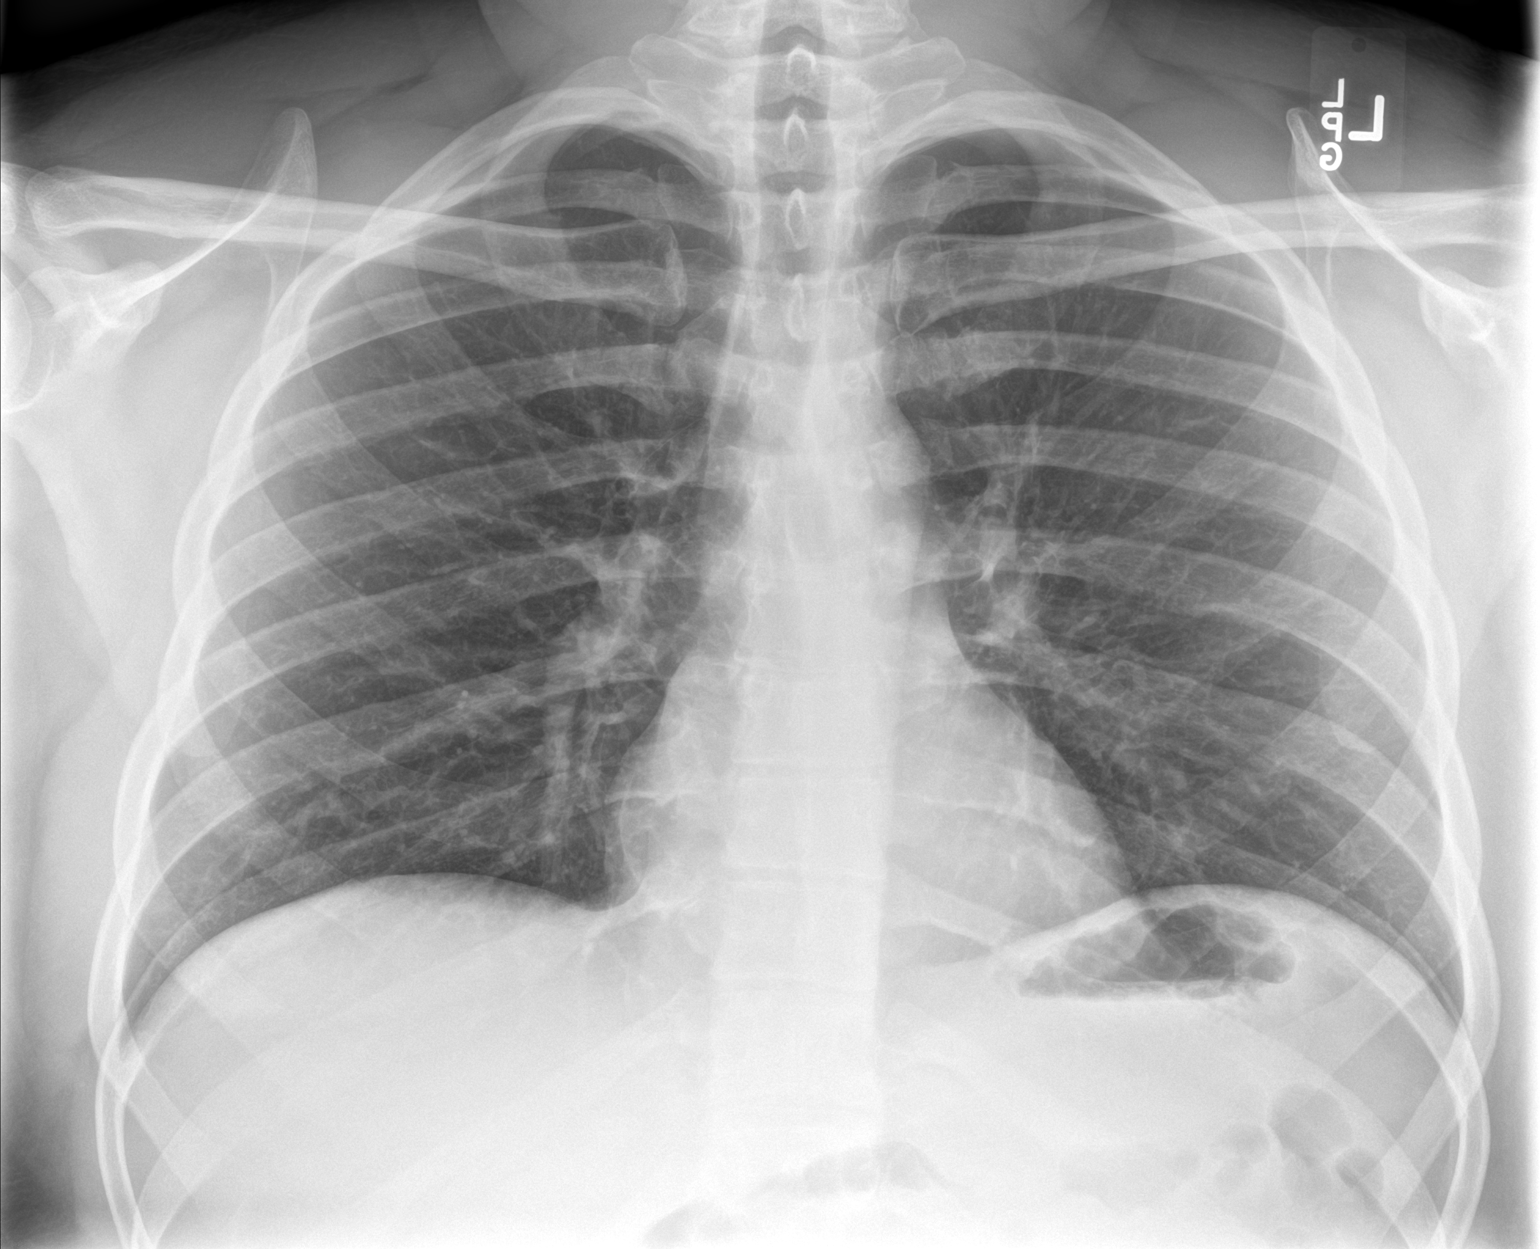

[1 of 1 positions shown; findings below may reference images not displayed]

FINDINGS: The heart size and mediastinal contours are within normal limits.
Both lungs are clear. The visualized skeletal structures are
unremarkable.
IMPRESSION: No acute abnormality of the lungs in frontal projection.

## 2022-01-30 ENCOUNTER — Ambulatory Visit: Payer: 59

## 2022-06-22 ENCOUNTER — Ambulatory Visit (INDEPENDENT_AMBULATORY_CARE_PROVIDER_SITE_OTHER): Payer: 59 | Admitting: Internal Medicine

## 2022-06-22 ENCOUNTER — Encounter: Payer: Self-pay | Admitting: Internal Medicine

## 2022-06-22 VITALS — BP 136/84 | HR 62 | Ht 74.0 in | Wt 309.2 lb

## 2022-06-22 DIAGNOSIS — Z6839 Body mass index (BMI) 39.0-39.9, adult: Secondary | ICD-10-CM

## 2022-06-22 DIAGNOSIS — E66812 Obesity, class 2: Secondary | ICD-10-CM

## 2022-06-22 NOTE — Progress Notes (Signed)
Subjective:    Patient ID: Jamie Ortiz, male    DOB: [birth year], 36 y.o.   MRN: [id number]  HPI  Patient presents to clinic today for his annual exam.  Flu: never Tetanus: 12/2016 COVID: never Dentist: biannually  Diet: He does eat meat. He consumes fruits and veggies. He does eat some fried foods. He drinks mostly water, sweet tea. Exercise: Walking/Running  Review of Systems     Past Medical History:  Diagnosis Date   Morbid obesity ([place])     Current Outpatient Medications  Medication Sig Dispense Refill   erythromycin ophthalmic ointment Place a 1/2 inch ribbon of ointment into the lower eyelid. 3.5 g 0   Multiple Vitamins-Minerals (MENS MULTI VITAMIN & MINERAL PO) Take by mouth.     No current facility-administered medications for this visit.    No Known Allergies  Family History  Problem Relation Age of Onset   Hypertension Mother    Cancer Father        lung, prostate   Healthy Sister     Social History   Socioeconomic History   Marital status: Single    Spouse name: Not on file   Number of children: Not on file   Years of education: Not on file   Highest education level: High school graduate  Occupational History   Not on file  Tobacco Use   Smoking status: Never   Smokeless tobacco: Never  Vaping Use   Vaping Use: Never used  Substance and Sexual Activity   Alcohol use: No   Drug use: No   Sexual activity: Yes    Birth control/protection: None  Other Topics Concern   Not on file  Social History Narrative   Not on file   Social Determinants of Health   Financial Resource Strain: Not on file  Food Insecurity: Not on file  Transportation Needs: Not on file  Physical Activity: Not on file  Stress: Not on file  Social Connections: Not on file  Intimate Partner Violence: Not on file     Constitutional: Denies fever, malaise, fatigue, headache or abrupt weight changes.  HEENT: Denies eye pain, eye redness, ear pain, ringing in the  ears, wax buildup, runny nose, nasal congestion, bloody nose, or sore throat. Respiratory: Denies difficulty breathing, shortness of breath, cough or sputum production.   Cardiovascular: Denies chest pain, chest tightness, palpitations or swelling in the hands or feet.  Gastrointestinal: Denies abdominal pain, bloating, constipation, diarrhea or blood in the stool.  GU: Denies urgency, frequency, pain with urination, burning sensation, blood in urine, odor or discharge. Musculoskeletal: Pt reports intermittent left posterior thigh pain. Denies decrease in range of motion, difficulty with gait, or joint pain and swelling.  Skin: Denies redness, rashes, lesions or ulcercations.  Neurological: Denies dizziness, difficulty with memory, difficulty with speech or problems with balance and coordination.  Psych: Denies anxiety, depression, SI/HI.  No other specific complaints in a complete review of systems (except as listed in HPI above).  Objective:   Physical Exam   BP 136/84 (BP Location: Right Arm, Patient Position: Sitting)   Pulse 62   Ht 6\' 2"  (1.88 m)   Wt (!) 309 lb 3.2 oz (140.3 kg)   SpO2 97%   BMI 39.70 kg/m   Wt Readings from Last 3 Encounters:  12/23/20 (!) 305 lb (138.3 kg)  11/04/20 (!) 305 lb 12.8 oz (138.7 kg)  06/01/20 (!) 304 lb 3.2 oz (138 kg)    General: Appears his stated  age, obese, in NAD. Skin: Warm, dry and intact.  HEENT: Head: normal shape and size; Eyes: sclera white, no icterus, conjunctiva pink, PERRLA and EOMs intact;  Neck:  Neck supple, trachea midline. No masses, lumps or thyromegaly present.  Cardiovascular: Normal rate and rhythm. S1,S2 noted.  No murmur, rubs or gallops noted. No JVD or BLE edema.  Pulmonary/Chest: Normal effort and positive vesicular breath sounds. No respiratory distress. No wheezes, rales or ronchi noted.  Abdomen: Soft and nontender. Normal bowel sounds.  Musculoskeletal: No pain with palpation of the left knee.  No joint  swelling of the left knee noted.  Strength 5/5 BUE/BLE.  No difficulty with gait.  Neurological: Alert and oriented. Cranial nerves II-XII grossly intact. Coordination normal.  Psychiatric: Mood and affect normal. Behavior is normal. Judgment and thought content normal.     BMET    Component Value Date/Time   NA 138 11/04/2020 1119   NA 141 05/31/2018 1142   K 4.1 11/04/2020 1119   CL 104 11/04/2020 1119   CO2 27 11/04/2020 1119   GLUCOSE 85 11/04/2020 1119   BUN 14 11/04/2020 1119   BUN 10 05/31/2018 1142   CREATININE 1.07 11/04/2020 1119   CALCIUM 9.2 11/04/2020 1119   GFRNONAA 100 03/01/2020 1037   GFRAA 116 03/01/2020 1037    Lipid Panel     Component Value Date/Time   CHOL 190 11/04/2020 1119   CHOL 177 05/31/2018 1142   TRIG 140 11/04/2020 1119   HDL 47 11/04/2020 1119   HDL 42 05/31/2018 1142   CHOLHDL 4.0 11/04/2020 1119   LDLCALC 118 (H) 11/04/2020 1119    CBC    Component Value Date/Time   WBC 3.4 (L) 11/04/2020 1119   RBC 5.01 11/04/2020 1119   HGB 13.8 11/04/2020 1119   HGB 14.3 05/31/2018 1142   HCT 43.3 11/04/2020 1119   HCT 43.5 05/31/2018 1142   PLT 229 11/04/2020 1119   PLT 255 05/31/2018 1142   MCV 86.4 11/04/2020 1119   MCV 85 05/31/2018 1142   MCH 27.5 11/04/2020 1119   MCHC 31.9 (L) 11/04/2020 1119   RDW 13.6 11/04/2020 1119   RDW 13.4 05/31/2018 1142   LYMPHSABS 2,021 06/01/2020 0852   LYMPHSABS 1.1 05/31/2018 1142   EOSABS 212 06/01/2020 0852   EOSABS 0.1 05/31/2018 1142   BASOSABS 41 06/01/2020 0852   BASOSABS 0.0 05/31/2018 1142    Hgb A1C Lab Results  Component Value Date   HGBA1C 5.5 11/04/2020           Assessment & Plan:   Preventative Health Maintenance:  Encouraged him to get a flu shot fall Tetanus UTD Encouraged him to get his COVID-vaccine Encouraged him to consume a balanced diet and exercise regimen Advised him to see an eye doctor and dentist annually He declines lab work today  RTC in 6 months,  follow-up chronic conditions Webb Silversmith, NP

## 2022-06-22 NOTE — Assessment & Plan Note (Signed)
Encourage diet and exercise for weight loss 

## 2022-06-22 NOTE — Patient Instructions (Signed)
Health Maintenance, Male Adopting a healthy lifestyle and getting preventive care are important in promoting health and wellness. Ask your health care provider about: The right schedule for you to have regular tests and exams. Things you can do on your own to prevent diseases and keep yourself healthy. What should I know about diet, weight, and exercise? Eat a healthy diet  Eat a diet that includes plenty of vegetables, fruits, low-fat dairy products, and lean protein. Do not eat a lot of foods that are high in solid fats, added sugars, or sodium. Maintain a healthy weight Body mass index (BMI) is a measurement that can be used to identify possible weight problems. It estimates body fat based on height and weight. Your health care provider can help determine your BMI and help you achieve or maintain a healthy weight. Get regular exercise Get regular exercise. This is one of the most important things you can do for your health. Most adults should: Exercise for at least 150 minutes each week. The exercise should increase your heart rate and make you sweat (moderate-intensity exercise). Do strengthening exercises at least twice a week. This is in addition to the moderate-intensity exercise. Spend less time sitting. Even light physical activity can be beneficial. Watch cholesterol and blood lipids Have your blood tested for lipids and cholesterol at 35 years of age, then have this test every 5 years. You may need to have your cholesterol levels checked more often if: Your lipid or cholesterol levels are high. You are older than 35 years of age. You are at high risk for heart disease. What should I know about cancer screening? Many types of cancers can be detected early and may often be prevented. Depending on your health history and family history, you may need to have cancer screening at various ages. This may include screening for: Colorectal cancer. Prostate cancer. Skin cancer. Lung  cancer. What should I know about heart disease, diabetes, and high blood pressure? Blood pressure and heart disease High blood pressure causes heart disease and increases the risk of stroke. This is more likely to develop in people who have high blood pressure readings or are overweight. Talk with your health care provider about your target blood pressure readings. Have your blood pressure checked: Every 3-5 years if you are 18-39 years of age. Every year if you are 40 years old or older. If you are between the ages of 65 and 75 and are a current or former smoker, ask your health care provider if you should have a one-time screening for abdominal aortic aneurysm (AAA). Diabetes Have regular diabetes screenings. This checks your fasting blood sugar level. Have the screening done: Once every three years after age 45 if you are at a normal weight and have a low risk for diabetes. More often and at a younger age if you are overweight or have a high risk for diabetes. What should I know about preventing infection? Hepatitis B If you have a higher risk for hepatitis B, you should be screened for this virus. Talk with your health care provider to find out if you are at risk for hepatitis B infection. Hepatitis C Blood testing is recommended for: Everyone born from 1945 through 1965. Anyone with known risk factors for hepatitis C. Sexually transmitted infections (STIs) You should be screened each year for STIs, including gonorrhea and chlamydia, if: You are sexually active and are younger than 35 years of age. You are older than 35 years of age and your   health care provider tells you that you are at risk for this type of infection. Your sexual activity has changed since you were last screened, and you are at increased risk for chlamydia or gonorrhea. Ask your health care provider if you are at risk. Ask your health care provider about whether you are at high risk for HIV. Your health care provider  may recommend a prescription medicine to help prevent HIV infection. If you choose to take medicine to prevent HIV, you should first get tested for HIV. You should then be tested every 3 months for as long as you are taking the medicine. Follow these instructions at home: Alcohol use Do not drink alcohol if your health care provider tells you not to drink. If you drink alcohol: Limit how much you have to 0-2 drinks a day. Know how much alcohol is in your drink. In the [place] one drink equals one 12 oz bottle of beer (355 mL), one 5 oz glass of wine (148 mL), or one 1 oz glass of hard liquor (44 mL). Lifestyle Do not use any products that contain nicotine or tobacco. These products include cigarettes, chewing tobacco, and vaping devices, such as e-cigarettes. If you need help quitting, ask your health care provider. Do not use street drugs. Do not share needles. Ask your health care provider for help if you need support or information about quitting drugs. General instructions Schedule regular health, dental, and eye exams. Stay current with your vaccines. Tell your health care provider if: You often feel depressed. You have ever been abused or do not feel safe at home. Summary Adopting a healthy lifestyle and getting preventive care are important in promoting health and wellness. Follow your health care provider's instructions about healthy diet, exercising, and getting tested or screened for diseases. Follow your health care provider's instructions on monitoring your cholesterol and blood pressure. This information is not intended to replace advice given to you by your health care provider. Make sure you discuss any questions you have with your health care provider. Document Revised: 08/09/2020 Document Reviewed: 08/09/2020 Elsevier Patient Education  2023 [employer]  

## 2022-11-17 ENCOUNTER — Ambulatory Visit: Payer: 59 | Admitting: Internal Medicine

## 2022-11-17 ENCOUNTER — Encounter: Payer: Self-pay | Admitting: Internal Medicine

## 2022-11-17 VITALS — BP 134/88 | HR 68 | Temp 96.6°F | Wt 306.0 lb

## 2022-11-17 DIAGNOSIS — M79651 Pain in right thigh: Secondary | ICD-10-CM

## 2022-11-17 MED ORDER — PREDNISONE 10 MG PO TABS: ORAL_TABLET | ORAL | 0 refills | Status: DC

## 2022-11-17 NOTE — Patient Instructions (Signed)
Quadriceps Strain Rehab Ask your health care provider which exercises are safe for you. Do exercises exactly as told by your health care provider and adjust them as directed. It is normal to feel mild stretching, pulling, tightness, or discomfort as you do these exercises. Stop right away if you feel sudden pain or your pain gets worse. Do not begin these exercises until told by your health care provider. Stretching and range-of-motion exercises These exercises warm up your muscles and joints and improve the movement and flexibility of your thigh. These exercises can also help to relieve stiffness or swelling. Heel slides  Lie on your back with both legs straight. If this causes back discomfort, bend the knee of your healthy leg so your foot is flat on the floor. Slowly slide your left / right heel back toward your buttocks. Stop when you feel a gentle stretch in the front of your knee or thigh (quadriceps). Hold this position for __________ seconds. Slowly slide your left / right heel back to the starting position. Repeat __________ times. Complete this exercise __________ times a day. Quadriceps stretch, prone  Lie on your abdomen on a firm surface, such as a bed or padded floor (prone position). Bend your left / right knee and hold your ankle. If you cannot reach your ankle or pant leg, loop a belt around your foot and grab the belt instead. Gently pull your heel toward your buttocks. Your knee should not slide out to the side. You should feel a stretch in the front of your thigh and knee (quadriceps). Hold this position for __________ seconds. Repeat __________ times. Complete this exercise __________ times a day. Strengthening exercises These exercises build strength and endurance in your thigh. Endurance is the ability to use your muscles for a long time, even after your muscles get tired. Straight leg raises, supine  This exercise stretches the muscles in front of your thigh (quadriceps)  and the muscles that move your hips (hip flexors). Quality counts! Watch for signs that the quadriceps muscle is working to ensure that you are strengthening the correct muscles and not cheating by using healthier muscles. Lie on your back (supine position) with your left / right leg extended and your other knee bent. Tense the muscles in the front of your left / right thigh. You should see your kneecap slide up or see increased dimpling just above the knee. Tighten these muscles even more and raise your leg 4-6 inches (10-15 cm) off the floor. Hold this position for __________ seconds. Keep the thigh muscles tense as you lower your leg. Relax the muscles slowly and completely after each repetition. Repeat __________ times. Complete this exercise __________ times a day. Leg raises, prone This exercise strengthens the muscles that move the hips (hip extensors). Lie on your abdomen on a bed or a firm surface (prone position). Place a pillow under your hips. Bend your left / right knee so your foot is straight up in the air. Squeeze your buttocks muscles and lift your left / right thigh off the bed. Do not let your back arch. Hold this position for __________ seconds. Slowly return to the starting position. Let your muscles relax completely before doing another repetition. Repeat __________ times. Complete this exercise __________ times a day. Wall sits Follow the directions for form closely. Knee pain can occur if your feet or knees are not placed properly. Lean your back against a smooth wall or door, and walk your feet out 18-24 inches (46-61 cm)  from it. Place your feet hip-width apart. Slowly slide down the wall or door until your knees bend __________ degrees. Keep your weight back and over your heels, not over your toes. Keep your thighs straight or pointing slightly outward. Hold this position for __________ seconds. Use your thigh and buttocks muscles to push yourself back up to a  standing position. Keep your weight through your heels while you do this. Rest for __________ seconds after each repetition. Repeat __________ times. Complete this exercise __________ times a day. This information is not intended to replace advice given to you by your health care provider. Make sure you discuss any questions you have with your health care provider. Document Revised: 07/24/2022 Document Reviewed: 09/06/2020 Elsevier Patient Education  2024 [employer].

## 2022-11-17 NOTE — Progress Notes (Signed)
Subjective:    Patient ID: Jamie Ortiz, male    DOB: [birth year], 37 y.o.   MRN: [id number]  HPI  Pt presents to the clinic today with c/o right thigh pain. This started 1 month ago. He describes the pain as sore. The pain is worse with certain movements like lifting his right leg or laying on his right side. The pain does not radiate. He denies numbness, tingling or weakness of the right lower extremity. He denies an injury to the area, but did get a SUV 1 month ago and not sure if having to lift to get in an out of the car is a contributing factor. He has tried massage, heat and ice with minimal relief of symptoms.   Review of Systems  Past Medical History:  Diagnosis Date   Morbid obesity ([facility])     Current Outpatient Medications  Medication Sig Dispense Refill   Multiple Vitamins-Minerals (MENS MULTI VITAMIN & MINERAL PO) Take by mouth.     No current facility-administered medications for this visit.    No Known Allergies  Family History  Problem Relation Age of Onset   Hypertension Mother    Cancer Father        lung, prostate   Healthy Sister     Social History   Socioeconomic History   Marital status: Single    Spouse name: Not on file   Number of children: Not on file   Years of education: Not on file   Highest education level: High school graduate  Occupational History   Not on file  Tobacco Use   Smoking status: Never   Smokeless tobacco: Never  Vaping Use   Vaping status: Never Used  Substance and Sexual Activity   Alcohol use: No   Drug use: No   Sexual activity: Yes    Birth control/protection: None  Other Topics Concern   Not on file  Social History Narrative   Not on file   Social Determinants of Health   Financial Resource Strain: Not on file  Food Insecurity: Not on file  Transportation Needs: Not on file  Physical Activity: Not on file  Stress: Not on file (05/15/2019)  Social Connections: Not on file  Intimate Partner Violence: Low  Risk  (07/14/2019)   Received [facility]   Intimate Partner Violence    Insults You: Not on file    Threatens You: Not on file    Screams at You: Not on file    Physically Hurt: Not on file    Intimate Partner Violence Score: Not on file     Constitutional: Denies fever, malaise, fatigue, headache or abrupt weight changes.  Respiratory: Denies difficulty breathing, shortness of breath, cough or sputum production.   Cardiovascular: Denies chest pain, chest tightness, palpitations or swelling in the hands or feet.  Musculoskeletal: Pt reports right thigh pain. Denies decrease in range of motion, difficulty with gait,or joint pain and swelling.  Skin: Denies redness, rashes, lesions or ulcercations.  Neurological: Denies dizziness, difficulty with memory, difficulty with speech or problems with balance and coordination.    No other specific complaints in a complete review of systems (except as listed in HPI above).     Objective:   Physical Exam  BP 134/88 (BP Location: Left Arm, Patient Position: Sitting, Cuff Size: Large)   Pulse 68   Temp (!) 96.6 F (35.9 C) (Temporal)   Wt (!) 306 lb (138.8 kg)   SpO2 99%  BMI 39.29 kg/m   Wt Readings from Last 3 Encounters:  06/22/22 (!) 309 lb 3.2 oz (140.3 kg)  12/23/20 (!) 305 lb (138.3 kg)  11/04/20 (!) 305 lb 12.8 oz (138.7 kg)    General: Appears his stated age, obese, in NAD. Skin: Warm, dry and intact.  Cardiovascular: Normal rate and rhythm. S1,S2 noted.  No murmur, rubs or gallops noted.  Pulmonary/Chest: Normal effort and positive vesicular breath sounds. No respiratory distress. No wheezes, rales or ronchi noted.  Musculoskeletal: Normal abduction, adduction, flexion, extension, internal and external rotation of the right hip.  Pain with palpation over the top of the quadricep.  Strength 5/5 BLE.  Able to stand on tiptoes and heels.  No difficulty with gait.  Neurological: Alert and oriented.  Coordination normal.     BMET    Component Value Date/Time   NA 138 11/04/2020 1119   NA 141 05/31/2018 1142   K 4.1 11/04/2020 1119   CL 104 11/04/2020 1119   CO2 27 11/04/2020 1119   GLUCOSE 85 11/04/2020 1119   BUN 14 11/04/2020 1119   BUN 10 05/31/2018 1142   CREATININE 1.07 11/04/2020 1119   CALCIUM 9.2 11/04/2020 1119   GFRNONAA 100 03/01/2020 1037   GFRAA 116 03/01/2020 1037    Lipid Panel     Component Value Date/Time   CHOL 190 11/04/2020 1119   CHOL 177 05/31/2018 1142   TRIG 140 11/04/2020 1119   HDL 47 11/04/2020 1119   HDL 42 05/31/2018 1142   CHOLHDL 4.0 11/04/2020 1119   LDLCALC 118 (H) 11/04/2020 1119    CBC    Component Value Date/Time   WBC 3.4 (L) 11/04/2020 1119   RBC 5.01 11/04/2020 1119   HGB 13.8 11/04/2020 1119   HGB 14.3 05/31/2018 1142   HCT 43.3 11/04/2020 1119   HCT 43.5 05/31/2018 1142   PLT 229 11/04/2020 1119   PLT 255 05/31/2018 1142   MCV 86.4 11/04/2020 1119   MCV 85 05/31/2018 1142   MCH 27.5 11/04/2020 1119   MCHC 31.9 (L) 11/04/2020 1119   RDW 13.6 11/04/2020 1119   RDW 13.4 05/31/2018 1142   LYMPHSABS 2,021 06/01/2020 0852   LYMPHSABS 1.1 05/31/2018 1142   EOSABS 212 06/01/2020 0852   EOSABS 0.1 05/31/2018 1142   BASOSABS 41 06/01/2020 0852   BASOSABS 0.0 05/31/2018 1142    Hgb A1C Lab Results  Component Value Date   HGBA1C 5.5 11/04/2020           Assessment & Plan:  Right thigh pain:  Question strain versus overuse Rx for Pred taper x 9 days If no improvement, consider x-ray versus PT for further evaluation  RTC in 7 months for you annual exam Nicki Reaper, NP

## 2022-12-20 ENCOUNTER — Telehealth: Payer: Self-pay

## 2022-12-20 DIAGNOSIS — M79651 Pain in right thigh: Secondary | ICD-10-CM

## 2022-12-20 NOTE — Telephone Encounter (Signed)
Please review.  Will he need an appointment with you first?   Thanks,   -Vernona Rieger

## 2022-12-20 NOTE — Telephone Encounter (Signed)
Copied from CRM [id number]. Topic: General - Inquiry >> Dec 20, 2022  8:56 AM Jamie Ortiz wrote: Patient stated he was no better from his last office visit and was inquiring if he could have imaging or physical therapy. He has took his medication and put ice on it but the soreness of his right leg keeps coming back. Offered to schedule an OV for patient and he stated send PCP a message to see if he needs an OV or if imaging or physical therapy can be ordered.  Patients callback # [phone]

## 2022-12-21 NOTE — Telephone Encounter (Signed)
I can offer him a referral to physical therapy without an additional appointment.  Let me know if he is agreeable with this.

## 2022-12-21 NOTE — Telephone Encounter (Signed)
Referral to PT placed

## 2022-12-21 NOTE — Addendum Note (Signed)
Addended by: Lorre Munroe on: 12/21/2022 09:06 AM   Modules accepted: Orders

## 2022-12-21 NOTE — Telephone Encounter (Signed)
Pt agreed to start PT.   Thanks,   -Vernona Rieger

## 2022-12-28 ENCOUNTER — Ambulatory Visit: Payer: 59 | Admitting: Internal Medicine

## 2023-04-10 ENCOUNTER — Encounter: Payer: Self-pay | Admitting: Internal Medicine

## 2023-04-10 ENCOUNTER — Ambulatory Visit: Payer: 59 | Admitting: Internal Medicine

## 2023-04-10 VITALS — BP 128/78 | Ht 74.0 in | Wt 315.6 lb

## 2023-04-10 DIAGNOSIS — L2082 Flexural eczema: Secondary | ICD-10-CM | POA: Diagnosis not present

## 2023-04-10 DIAGNOSIS — N481 Balanitis: Secondary | ICD-10-CM | POA: Diagnosis not present

## 2023-04-10 MED ORDER — CLOTRIMAZOLE-BETAMETHASONE 1-0.05 % EX CREA: TOPICAL_CREAM | Freq: Two times a day (BID) | CUTANEOUS | 1 refills | Status: DC

## 2023-04-10 NOTE — Progress Notes (Signed)
 Subjective:    Patient ID: Jamie Ortiz, male    DOB: [birth year], 38 y.o.   MRN: [id number]  HPI  Discussed the use of AI scribe software for clinical note transcription with the patient, who gave verbal consent to proceed.   The patient presents with a rash localized to the penile area, which has been present since November 28th. This is the first time he has sought medical attention for this issue. The rash began to itch two days prior to the consultation. The rash initially appeared as a single spot, with a second spot appearing approximately a week later on the opposite side. He also noticed some skin irritation and possible swelling. The patient denies any penile or testicular problems, discharge, pain, swelling, or redness, except for transient redness after sexual intercourse. He has tried applying Aquaphor ointment, but it did not result in any noticeable change.  In addition to the penile rash, the patient also reports an irritation behind his right ear. He has been applying Acuvail on it, but the irritation has persisted and appears to have worsened. The patient admits to scratching the area.       Review of Systems   Past Medical History:  Diagnosis Date   Morbid obesity (HCC)     Current Outpatient Medications  Medication Sig Dispense Refill   Multiple Vitamins-Minerals (MENS MULTI VITAMIN & MINERAL PO) Take by mouth.     predniSONE  (DELTASONE ) 10 MG tablet Take 3 tabs on days 1-3, 2 tabs on days 4-6, 1 tab on days 7-9 (Patient not taking: Reported on 04/10/2023) 18 tablet 0   No current facility-administered medications for this visit.    No Known Allergies  Family History  Problem Relation Age of Onset   Hypertension Mother    Cancer Father        lung, prostate   Healthy Sister     Social History   Socioeconomic History   Marital status: Single    Spouse name: Not on file   Number of children: Not on file   Years of education: Not on file   Highest  education level: High school graduate  Occupational History   Not on file  Tobacco Use   Smoking status: Never   Smokeless tobacco: Never  Vaping Use   Vaping status: Never Used  Substance and Sexual Activity   Alcohol use: No   Drug use: No   Sexual activity: Yes    Birth control/protection: None  Other Topics Concern   Not on file  Social History Narrative   Not on file   Social Drivers of Health   Financial Resource Strain: Not on file  Food Insecurity: Not on file  Transportation Needs: Not on file  Physical Activity: Not on file  Stress: Not on file (02/04/2023)  Social Connections: Not on file  Intimate Partner Violence: Low Risk  (07/14/2019)   Received from [facility]   Intimate Partner Violence    Insults You: Not on file    Threatens You: Not on file    Screams at You: Not on file    Physically Hurt: Not on file    Intimate Partner Violence Score: Not on file     Constitutional: Denies fever, malaise, fatigue, headache or abrupt weight changes.  Respiratory: Denies difficulty breathing, shortness of breath, cough or sputum production.   Cardiovascular: Denies chest pain, chest tightness, palpitations or swelling in the hands or feet.  Gastrointestinal: Denies abdominal pain, bloating, constipation,  diarrhea or blood in the stool.  GU: Denies urgency, frequency, pain with urination, burning sensation, blood in urine, odor or discharge. Skin: Pt reports penile rash, rash behind right ear. Denies redness, or ulcercations.    No other specific complaints in a complete review of systems (except as listed in HPI above).      Objective:   Physical Exam  BP 128/78 (BP Location: Left Arm, Patient Position: Sitting, Cuff Size: Large)   Ht 6' 2 (1.88 m)   Wt (!) 315 lb 9.6 oz (143.2 kg)   BMI 40.52 kg/m  Wt Readings from Last 3 Encounters:  04/10/23 (!) 315 lb 9.6 oz (143.2 kg)  11/17/22 (!) 306 lb (138.8 kg)  06/22/22 (!) 309 lb 3.2 oz  (140.3 kg)    General: Appears his stated age, obese, in NAD. Skin: Hypopigmented maculopapular rash noted at the base of the glans penis circumferentially.  Excoriated maculopapular rash noted in the flexure of the right ear fold. Cardiovascular: Normal rate and rhythm.  Pulmonary/Chest: Normal effort and positive vesicular breath sounds. No respiratory distress. No wheezes, rales or ronchi noted.  Neurological: Alert and oriented.   BMET    Component Value Date/Time   NA 138 11/04/2020 1119   NA 141 05/31/2018 1142   K 4.1 11/04/2020 1119   CL 104 11/04/2020 1119   CO2 27 11/04/2020 1119   GLUCOSE 85 11/04/2020 1119   BUN 14 11/04/2020 1119   BUN 10 05/31/2018 1142   CREATININE 1.07 11/04/2020 1119   CALCIUM 9.2 11/04/2020 1119   GFRNONAA 100 03/01/2020 1037   GFRAA 116 03/01/2020 1037    Lipid Panel     Component Value Date/Time   CHOL 190 11/04/2020 1119   CHOL 177 05/31/2018 1142   TRIG 140 11/04/2020 1119   HDL 47 11/04/2020 1119   HDL 42 05/31/2018 1142   CHOLHDL 4.0 11/04/2020 1119   LDLCALC 118 (H) 11/04/2020 1119    CBC    Component Value Date/Time   WBC 3.4 (L) 11/04/2020 1119   RBC 5.01 11/04/2020 1119   HGB 13.8 11/04/2020 1119   HGB 14.3 05/31/2018 1142   HCT 43.3 11/04/2020 1119   HCT 43.5 05/31/2018 1142   PLT 229 11/04/2020 1119   PLT 255 05/31/2018 1142   MCV 86.4 11/04/2020 1119   MCV 85 05/31/2018 1142   MCH 27.5 11/04/2020 1119   MCHC 31.9 (L) 11/04/2020 1119   RDW 13.6 11/04/2020 1119   RDW 13.4 05/31/2018 1142   LYMPHSABS 2,021 06/01/2020 0852   LYMPHSABS 1.1 05/31/2018 1142   EOSABS 212 06/01/2020 0852   EOSABS 0.1 05/31/2018 1142   BASOSABS 41 06/01/2020 0852   BASOSABS 0.0 05/31/2018 1142    Hgb A1C Lab Results  Component Value Date   HGBA1C 5.5 11/04/2020            Assessment & Plan:   Assessment and Plan    Balanitis Localized, non-spreading rash on the penis present since November 28th. No associated  discharge, pain, or redness. No improvement with Aquaphor. No signs of herpes or other sexually transmitted infections on examination. -Prescribe Lotrisone  cream, apply twice daily.  Eczema behind the ear Irritated, itchy rash behind the right ear. Patient has been scratching it. -Advise to use Letrozone cream or over-the-counter hydrocortisone  cream.  RTC in 2 months for your annual exam Angeline Laura, NP

## 2023-04-10 NOTE — Patient Instructions (Signed)
 Balanitis  Balanitis is swelling and irritation of the head of the penis (glans penis). Balanitis occurs most often among males who have not had their foreskin removed (uncircumcised). In uncircumcised males, the condition may also cause inflammation of the skin around the foreskin. Balanitis sometimes causes scarring of the penis or foreskin, which can require surgery. This condition may develop because of an infection or another medical condition. Untreated balanitis can increase the risk of penile cancer. What are the causes? Common causes of this condition include: Irritation and lack of airflow due to fluid (smegma) that can build up on the glans penis. Poor personal hygiene, especially in uncircumcised males. Not cleaning the glans penis and foreskin well can result in a buildup of bacteria, viruses, and yeast, which can lead to infection and inflammation. Other causes include: Chemical irritation from products such as soaps or shower gels, especially those that have fragrance. Chemical irritation can also be caused by condoms, personal lubricants, petroleum jelly, spermicides, fabric softeners, or laundry detergents. Skin conditions, such as eczema, dermatitis, and psoriasis. Allergies to medicines, such as tetracycline and sulfa drugs. What increases the risk? The following factors may make you more likely to develop this condition: Being an uncircumcised male. Having diabetes. Having other medical conditions, including liver cirrhosis, congestive heart failure, or kidney disease. Having infections, such as candidiasis, HPV (human papillomavirus), herpes simplex, gonorrhea, or syphilis. Having a tight foreskin that is difficult to pull back (retract) past the glans penis. Being severely obese. History of reactive arthritis. What are the signs or symptoms? Symptoms of this condition include: Discharge from under the foreskin, and pain or difficulty retracting the foreskin. A bad smell  or itchiness on the penis. Tenderness, redness, and swelling of the glans penis. A rash or sores on the glans penis or foreskin. Inability to get an erection due to pain. Trouble urinating. Scarring of the penis or foreskin, in some cases. How is this diagnosed? This condition may be diagnosed based on a physical exam and tests of a swab of discharge to check for bacterial or fungal infection. You may also have blood tests to check for: Viruses that can cause balanitis. A high blood sugar (glucose) level. This could be a sign of diabetes, which can increase the risk of balanitis. How is this treated? Treatment for this condition depends on the cause. Treatment may include: Improving personal hygiene. Your health care provider may recommend sitting in a bath of warm water that is deep enough to cover your hips and buttocks (sitz bath). Medicines such as: Creams or ointments to reduce swelling (steroids) or to treat an infection. Antibiotic medicine. Antifungal medicine. Having surgery to remove or cut the foreskin (circumcision). This may be done if you have scarring on the foreskin that makes it difficult to retract. Controlling other medical problems that may be causing your condition or making it worse. Follow these instructions at home: Medicines Take over-the-counter and prescription medicines only as told by your health care provider. If you were prescribed an antibiotic medicine, use it as told by your health care provider. Do not stop using the antibiotic even if you start to feel better. General instructions Do not have sex until the condition clears up, or until your health care provider approves. Keep your penis clean and dry. Take sitz baths as recommended by your health care provider. Avoid products that irritate your skin or make symptoms worse, such as soaps and shower gels that have fragrance. Keep all follow-up visits. This is  important. Contact a health care provider  if: Your symptoms get worse or do not improve with home care. You develop chills or a fever. You have trouble urinating. You cannot retract your foreskin. Get help right away if: You develop severe pain. You are unable to urinate. Summary Balanitis is swelling and irritation of the head of the penis (glans penis). This condition is most common among uncircumcised males. Balanitis causes pain, redness, and swelling of the glans penis. Good personal hygiene is important. Treatment may include improving personal hygiene and applying creams or ointments. Contact a health care provider if your symptoms get worse or do not improve with home care. This information is not intended to replace advice given to you by your health care provider. Make sure you discuss any questions you have with your health care provider. Document Revised: 09/01/2020 Document Reviewed: 09/01/2020 Elsevier Patient Education  2024 [employer].

## 2023-06-25 ENCOUNTER — Encounter: Payer: Self-pay | Admitting: Internal Medicine

## 2023-06-25 NOTE — Progress Notes (Deleted)
 Subjective:    Patient ID: Jamie Ortiz, male    DOB: 01/14/1986, 38 y.o.   MRN: [id number]  HPI  Patient presents to clinic today for his annual exam.  Flu: never Tetanus: 12/2016 COVID: never Dentist: biannually  Diet: He does eat meat. He consumes fruits and veggies. He does eat some fried foods. He drinks mostly water, sweet tea. Exercise: Walking/Running  Review of Systems     Past Medical History:  Diagnosis Date   Morbid obesity (HCC)     Current Outpatient Medications  Medication Sig Dispense Refill   clotrimazole-betamethasone (LOTRISONE) cream Apply topically 2 (two) times daily. 30 g 1   Multiple Vitamins-Minerals (MENS MULTI VITAMIN & MINERAL PO) Take by mouth.     No current facility-administered medications for this visit.    No Known Allergies  Family History  Problem Relation Age of Onset   Hypertension Mother    Cancer Father        lung, prostate   Healthy Sister     Social History   Socioeconomic History   Marital status: Single    Spouse name: Not on file   Number of children: Not on file   Years of education: Not on file   Highest education level: High school graduate  Occupational History   Not on file  Tobacco Use   Smoking status: Never   Smokeless tobacco: Never  Vaping Use   Vaping status: Never Used  Substance and Sexual Activity   Alcohol use: No   Drug use: No   Sexual activity: Yes    Birth control/protection: None  Other Topics Concern   Not on file  Social History Narrative   Not on file   Social Drivers of Health   Financial Resource Strain: Not on file  Food Insecurity: Not on file  Transportation Needs: Not on file  Physical Activity: Not on file  Stress: Not on file (02/04/2023)  Social Connections: Not on file  Intimate Partner Violence: Low Risk  (07/14/2019)   Received from [facility]   Intimate Partner Violence    Insults You: Not on file    Threatens You: Not on file    Screams  at You: Not on file    Physically Hurt: Not on file    Intimate Partner Violence Score: Not on file     Constitutional: Denies fever, malaise, fatigue, headache or abrupt weight changes.  HEENT: Denies eye pain, eye redness, ear pain, ringing in the ears, wax buildup, runny nose, nasal congestion, bloody nose, or sore throat. Respiratory: Denies difficulty breathing, shortness of breath, cough or sputum production.   Cardiovascular: Denies chest pain, chest tightness, palpitations or swelling in the hands or feet.  Gastrointestinal: Denies abdominal pain, bloating, constipation, diarrhea or blood in the stool.  GU: Denies urgency, frequency, pain with urination, burning sensation, blood in urine, odor or discharge. Musculoskeletal: Pt reports intermittent left posterior thigh pain. Denies decrease in range of motion, difficulty with gait, or joint pain and swelling.  Skin: Denies redness, rashes, lesions or ulcercations.  Neurological: Denies dizziness, difficulty with memory, difficulty with speech or problems with balance and coordination.  Psych: Denies anxiety, depression, SI/HI.  No other specific complaints in a complete review of systems (except as listed in HPI above).  Objective:   Physical Exam   There were no vitals taken for this visit.  Wt Readings from Last 3 Encounters:  04/10/23 (!) 315 lb 9.6 oz (143.2 kg)  11/17/22 Marland Kitchen)  306 lb (138.8 kg)  06/22/22 (!) 309 lb 3.2 oz (140.3 kg)    General: Appears his stated age, obese, in NAD. Skin: Warm, dry and intact.  HEENT: Head: normal shape and size; Eyes: sclera white, no icterus, conjunctiva pink, PERRLA and EOMs intact;  Neck:  Neck supple, trachea midline. No masses, lumps or thyromegaly present.  Cardiovascular: Normal rate and rhythm. S1,S2 noted.  No murmur, rubs or gallops noted. No JVD or BLE edema.  Pulmonary/Chest: Normal effort and positive vesicular breath sounds. No respiratory distress. No wheezes, rales or  ronchi noted.  Abdomen: Soft and nontender. Normal bowel sounds.  Musculoskeletal: No pain with palpation of the left knee.  No joint swelling of the left knee noted.  Strength 5/5 BUE/BLE.  No difficulty with gait.  Neurological: Alert and oriented. Cranial nerves II-XII grossly intact. Coordination normal.  Psychiatric: Mood and affect normal. Behavior is normal. Judgment and thought content normal.     BMET    Component Value Date/Time   NA 138 11/04/2020 1119   NA 141 05/31/2018 1142   K 4.1 11/04/2020 1119   CL 104 11/04/2020 1119   CO2 27 11/04/2020 1119   GLUCOSE 85 11/04/2020 1119   BUN 14 11/04/2020 1119   BUN 10 05/31/2018 1142   CREATININE 1.07 11/04/2020 1119   CALCIUM 9.2 11/04/2020 1119   GFRNONAA 100 03/01/2020 1037   GFRAA 116 03/01/2020 1037    Lipid Panel     Component Value Date/Time   CHOL 190 11/04/2020 1119   CHOL 177 05/31/2018 1142   TRIG 140 11/04/2020 1119   HDL 47 11/04/2020 1119   HDL 42 05/31/2018 1142   CHOLHDL 4.0 11/04/2020 1119   LDLCALC 118 (H) 11/04/2020 1119    CBC    Component Value Date/Time   WBC 3.4 (L) 11/04/2020 1119   RBC 5.01 11/04/2020 1119   HGB 13.8 11/04/2020 1119   HGB 14.3 05/31/2018 1142   HCT 43.3 11/04/2020 1119   HCT 43.5 05/31/2018 1142   PLT 229 11/04/2020 1119   PLT 255 05/31/2018 1142   MCV 86.4 11/04/2020 1119   MCV 85 05/31/2018 1142   MCH 27.5 11/04/2020 1119   MCHC 31.9 (L) 11/04/2020 1119   RDW 13.6 11/04/2020 1119   RDW 13.4 05/31/2018 1142   LYMPHSABS 2,021 06/01/2020 0852   LYMPHSABS 1.1 05/31/2018 1142   EOSABS 212 06/01/2020 0852   EOSABS 0.1 05/31/2018 1142   BASOSABS 41 06/01/2020 0852   BASOSABS 0.0 05/31/2018 1142    Hgb A1C Lab Results  Component Value Date   HGBA1C 5.5 11/04/2020           Assessment & Plan:   Preventative Health Maintenance:  Encouraged him to get a flu shot fall Tetanus UTD Encouraged him to get his COVID-vaccine Encouraged him to consume a  balanced diet and exercise regimen Advised him to see an eye doctor and dentist annually We will check CBC, c-Met, lipid and A1c today  RTC in 6 months, follow-up chronic conditions Nicki Reaper, NP

## 2023-06-28 ENCOUNTER — Encounter: Payer: Self-pay | Admitting: Internal Medicine

## 2023-06-28 ENCOUNTER — Ambulatory Visit (INDEPENDENT_AMBULATORY_CARE_PROVIDER_SITE_OTHER): Admitting: Internal Medicine

## 2023-06-28 VITALS — BP 124/78 | Ht 74.0 in | Wt 317.0 lb

## 2023-06-28 DIAGNOSIS — Z0001 Encounter for general adult medical examination with abnormal findings: Secondary | ICD-10-CM

## 2023-06-28 DIAGNOSIS — Z113 Encounter for screening for infections with a predominantly sexual mode of transmission: Secondary | ICD-10-CM

## 2023-06-28 DIAGNOSIS — Z6841 Body Mass Index (BMI) 40.0 and over, adult: Secondary | ICD-10-CM

## 2023-06-28 DIAGNOSIS — L309 Dermatitis, unspecified: Secondary | ICD-10-CM | POA: Insufficient documentation

## 2023-06-28 DIAGNOSIS — R7303 Prediabetes: Secondary | ICD-10-CM | POA: Diagnosis not present

## 2023-06-28 DIAGNOSIS — E66813 Obesity, class 3: Secondary | ICD-10-CM

## 2023-06-28 DIAGNOSIS — E7801 Familial hypercholesterolemia: Secondary | ICD-10-CM | POA: Diagnosis not present

## 2023-06-28 DIAGNOSIS — L2082 Flexural eczema: Secondary | ICD-10-CM

## 2023-06-28 MED ORDER — HYDROCORTISONE ACETATE 25 MG RE SUPP
25.0000 mg | Freq: Two times a day (BID) | RECTAL | 0 refills | Status: DC
Start: 2023-06-28 — End: 2023-11-21

## 2023-06-28 MED ORDER — HYDROCORTISONE ACETATE 25 MG RE SUPP: 25.0000 mg | Freq: Two times a day (BID) | RECTAL | 0 refills | Status: DC

## 2023-06-28 MED ORDER — TRIAMCINOLONE ACETONIDE 0.025 % EX OINT: 1.0000 | TOPICAL_OINTMENT | Freq: Two times a day (BID) | CUTANEOUS | 0 refills | Status: DC

## 2023-06-28 NOTE — Patient Instructions (Signed)
 Health Maintenance, Male  Adopting a healthy lifestyle and getting preventive care are important in promoting health and wellness. Ask your health care provider about:  The right schedule for you to have regular tests and exams.  Things you can do on your own to prevent diseases and keep yourself healthy.  What should I know about diet, weight, and exercise?  Eat a healthy diet    Eat a diet that includes plenty of vegetables, fruits, low-fat dairy products, and lean protein.  Do not eat a lot of foods that are high in solid fats, added sugars, or sodium.  Maintain a healthy weight  Body mass index (BMI) is a measurement that can be used to identify possible weight problems. It estimates body fat based on height and weight. Your health care provider can help determine your BMI and help you achieve or maintain a healthy weight.  Get regular exercise  Get regular exercise. This is one of the most important things you can do for your health. Most adults should:  Exercise for at least 150 minutes each week. The exercise should increase your heart rate and make you sweat (moderate-intensity exercise).  Do strengthening exercises at least twice a week. This is in addition to the moderate-intensity exercise.  Spend less time sitting. Even light physical activity can be beneficial.  Watch cholesterol and blood lipids  Have your blood tested for lipids and cholesterol at 36 years of age, then have this test every 5 years.  You may need to have your cholesterol levels checked more often if:  Your lipid or cholesterol levels are high.  You are older than 36 years of age.  You are at high risk for heart disease.  What should I know about cancer screening?  Many types of cancers can be detected early and may often be prevented. Depending on your health history and family history, you may need to have cancer screening at various ages. This may include screening for:  Colorectal cancer.  Prostate cancer.  Skin cancer.  Lung  cancer.  What should I know about heart disease, diabetes, and high blood pressure?  Blood pressure and heart disease  High blood pressure causes heart disease and increases the risk of stroke. This is more likely to develop in people who have high blood pressure readings or are overweight.  Talk with your health care provider about your target blood pressure readings.  Have your blood pressure checked:  Every 3-5 years if you are 9-95 years of age.  Every year if you are 85 years old or older.  If you are between the ages of 29 and 29 and are a current or former smoker, ask your health care provider if you should have a one-time screening for abdominal aortic aneurysm (AAA).  Diabetes  Have regular diabetes screenings. This checks your fasting blood sugar level. Have the screening done:  Once every three years after age 23 if you are at a normal weight and have a low risk for diabetes.  More often and at a younger age if you are overweight or have a high risk for diabetes.  What should I know about preventing infection?  Hepatitis B  If you have a higher risk for hepatitis B, you should be screened for this virus. Talk with your health care provider to find out if you are at risk for hepatitis B infection.  Hepatitis C  Blood testing is recommended for:  Everyone born from [birth year] through 1963.  Anyone  with known risk factors for hepatitis C.  Sexually transmitted infections (STIs)  You should be screened each year for STIs, including gonorrhea and chlamydia, if:  You are sexually active and are younger than 36 years of age.  You are older than 36 years of age and your health care provider tells you that you are at risk for this type of infection.  Your sexual activity has changed since you were last screened, and you are at increased risk for chlamydia or gonorrhea. Ask your health care provider if you are at risk.  Ask your health care provider about whether you are at high risk for HIV. Your health care provider  may recommend a prescription medicine to help prevent HIV infection. If you choose to take medicine to prevent HIV, you should first get tested for HIV. You should then be tested every 3 months for as long as you are taking the medicine.  Follow these instructions at home:  Alcohol use  Do not drink alcohol if your health care provider tells you not to drink.  If you drink alcohol:  Limit how much you have to 0-2 drinks a day.  Know how much alcohol is in your drink. In the [place] one drink equals one 12 oz bottle of beer (355 mL), one 5 oz glass of wine (148 mL), or one 1 oz glass of hard liquor (44 mL).  Lifestyle  Do not use any products that contain nicotine or tobacco. These products include cigarettes, chewing tobacco, and vaping devices, such as e-cigarettes. If you need help quitting, ask your health care provider.  Do not use street drugs.  Do not share needles.  Ask your health care provider for help if you need support or information about quitting drugs.  General instructions  Schedule regular health, dental, and eye exams.  Stay current with your vaccines.  Tell your health care provider if:  You often feel depressed.  You have ever been abused or do not feel safe at home.  Summary  Adopting a healthy lifestyle and getting preventive care are important in promoting health and wellness.  Follow your health care provider's instructions about healthy diet, exercising, and getting tested or screened for diseases.  Follow your health care provider's instructions on monitoring your cholesterol and blood pressure.  This information is not intended to replace advice given to you by your health care provider. Make sure you discuss any questions you have with your health care provider.  Document Revised: 08/09/2020 Document Reviewed: 08/09/2020  Elsevier Patient Education  2024 [employer].

## 2023-06-28 NOTE — Assessment & Plan Note (Signed)
 Encourage diet and exercise for weight loss

## 2023-06-28 NOTE — Progress Notes (Signed)
 Subjective:    Patient ID: Jamie Ortiz, male    DOB: 24-Jun-1985, 38 y.o.   MRN: [id number]  HPI  Patient presents to clinic today for his annual exam.  Flu: never Tetanus: 12/2016 COVID: never Dentist: biannually  Diet: He does eat meat. He consumes fruits and veggies. He does eat some fried foods. He drinks mostly water, sweet tea. Exercise: Walking/Running  Review of Systems     Past Medical History:  Diagnosis Date   Morbid obesity (HCC)     Current Outpatient Medications  Medication Sig Dispense Refill   clotrimazole-betamethasone (LOTRISONE) cream Apply topically 2 (two) times daily. 30 g 1   Multiple Vitamins-Minerals (MENS MULTI VITAMIN & MINERAL PO) Take by mouth.     No current facility-administered medications for this visit.    No Known Allergies  Family History  Problem Relation Age of Onset   Hypertension Mother    Cancer Father        lung, prostate   Healthy Sister     Social History   Socioeconomic History   Marital status: Single    Spouse name: Not on file   Number of children: Not on file   Years of education: Not on file   Highest education level: High school graduate  Occupational History   Not on file  Tobacco Use   Smoking status: Never   Smokeless tobacco: Never  Vaping Use   Vaping status: Never Used  Substance and Sexual Activity   Alcohol use: No   Drug use: No   Sexual activity: Yes    Birth control/protection: None  Other Topics Concern   Not on file  Social History Narrative   Not on file   Social Drivers of Health   Financial Resource Strain: Not on file  Food Insecurity: Not on file  Transportation Needs: Not on file  Physical Activity: Not on file  Stress: Not on file (02/04/2023)  Social Connections: Not on file  Intimate Partner Violence: Low Risk  (07/14/2019)   Received from [facility]   Intimate Partner Violence    Insults You: Not on file    Threatens You: Not on file    Screams  at You: Not on file    Physically Hurt: Not on file    Intimate Partner Violence Score: Not on file     Constitutional: Denies fever, malaise, fatigue, headache or abrupt weight changes.  HEENT: Denies eye pain, eye redness, ear pain, ringing in the ears, wax buildup, runny nose, nasal congestion, bloody nose, or sore throat. Respiratory: Denies difficulty breathing, shortness of breath, cough or sputum production.   Cardiovascular: Denies chest pain, chest tightness, palpitations or swelling in the hands or feet.  Gastrointestinal: Patient reports that blood in stool.  Denies abdominal pain, bloating, constipation, diarrhea.  GU: Denies urgency, frequency, pain with urination, burning sensation, blood in urine, odor or discharge. Musculoskeletal: Pt reports intermittent left posterior thigh pain. Denies decrease in range of motion, difficulty with gait, or joint pain and swelling.  Skin: Pt reports rash of scalp. Denies redness, lesions or ulcercations.  Neurological: Denies dizziness, difficulty with memory, difficulty with speech or problems with balance and coordination.  Psych: Denies anxiety, depression, SI/HI.  No other specific complaints in a complete review of systems (except as listed in HPI above).  Objective:   Physical Exam   BP 124/78 (BP Location: Right Arm, Patient Position: Sitting, Cuff Size: Large)   Ht 6\' 2"  (1.88 m)  Wt (!) 317 lb (143.8 kg)   BMI 40.70 kg/m    Wt Readings from Last 3 Encounters:  04/10/23 (!) 315 lb 9.6 oz (143.2 kg)  11/17/22 (!) 306 lb (138.8 kg)  06/22/22 (!) 309 lb 3.2 oz (140.3 kg)    General: Appears his stated age, obese, in NAD. Skin: Warm, dry and intact.  Eczema noted behind right ear and along left side of scalp line at the forehead. HEENT: Head: normal shape and size; Eyes: sclera white, no icterus, conjunctiva pink, PERRLA and EOMs intact;  Neck:  Neck supple, trachea midline. No masses, lumps or thyromegaly present.   Cardiovascular: Normal rate and rhythm. S1,S2 noted.  No murmur, rubs or gallops noted. No JVD or BLE edema.  Pulmonary/Chest: Normal effort and positive vesicular breath sounds. No respiratory distress. No wheezes, rales or ronchi noted.  Abdomen: Soft and nontender. Normal bowel sounds.  Musculoskeletal: Strength 5/5 BUE/BLE.  No difficulty with gait.  Neurological: Alert and oriented. Cranial nerves II-XII grossly intact. Coordination normal.  Psychiatric: Mood and affect normal. Behavior is normal. Judgment and thought content normal.     BMET    Component Value Date/Time   NA 138 11/04/2020 1119   NA 141 05/31/2018 1142   K 4.1 11/04/2020 1119   CL 104 11/04/2020 1119   CO2 27 11/04/2020 1119   GLUCOSE 85 11/04/2020 1119   BUN 14 11/04/2020 1119   BUN 10 05/31/2018 1142   CREATININE 1.07 11/04/2020 1119   CALCIUM 9.2 11/04/2020 1119   GFRNONAA 100 03/01/2020 1037   GFRAA 116 03/01/2020 1037    Lipid Panel     Component Value Date/Time   CHOL 190 11/04/2020 1119   CHOL 177 05/31/2018 1142   TRIG 140 11/04/2020 1119   HDL 47 11/04/2020 1119   HDL 42 05/31/2018 1142   CHOLHDL 4.0 11/04/2020 1119   LDLCALC 118 (H) 11/04/2020 1119    CBC    Component Value Date/Time   WBC 3.4 (L) 11/04/2020 1119   RBC 5.01 11/04/2020 1119   HGB 13.8 11/04/2020 1119   HGB 14.3 05/31/2018 1142   HCT 43.3 11/04/2020 1119   HCT 43.5 05/31/2018 1142   PLT 229 11/04/2020 1119   PLT 255 05/31/2018 1142   MCV 86.4 11/04/2020 1119   MCV 85 05/31/2018 1142   MCH 27.5 11/04/2020 1119   MCHC 31.9 (L) 11/04/2020 1119   RDW 13.6 11/04/2020 1119   RDW 13.4 05/31/2018 1142   LYMPHSABS 2,021 06/01/2020 0852   LYMPHSABS 1.1 05/31/2018 1142   EOSABS 212 06/01/2020 0852   EOSABS 0.1 05/31/2018 1142   BASOSABS 41 06/01/2020 0852   BASOSABS 0.0 05/31/2018 1142    Hgb A1C Lab Results  Component Value Date   HGBA1C 5.5 11/04/2020           Assessment & Plan:   Preventative Health  Maintenance:  Encouraged him to get a flu shot fall Tetanus UTD Encouraged him to get his COVID-vaccine Encouraged him to consume a balanced diet and exercise regimen Advised him to see an eye doctor and dentist annually We will check CBC, c-Met, lipid and A1c today  Screening for STD:  Will check HIV, RPR and hep C today  Blood in stool:  Concerning for hemorrhoids Rx for Anusol suppositories 25 mg twice daily x 6 days  RTC in 6 months, follow-up chronic conditions Nicki Reaper, NP

## 2023-06-28 NOTE — Assessment & Plan Note (Signed)
 Will trial triamcinolone cream 0.1% twice daily.

## 2023-06-29 ENCOUNTER — Encounter: Payer: Self-pay | Admitting: Internal Medicine

## 2023-06-29 LAB — CBC
HCT: 41.5 % (ref 38.5–50.0)
Hemoglobin: 13.6 g/dL (ref 13.2–17.1)
MCH: 27.8 pg (ref 27.0–33.0)
MCHC: 32.8 g/dL (ref 32.0–36.0)
MCV: 84.9 fL (ref 80.0–100.0)
MPV: 10.4 fL (ref 7.5–12.5)
Platelets: 281 10*3/uL (ref 140–400)
RBC: 4.89 10*6/uL (ref 4.20–5.80)
RDW: 13.6 % (ref 11.0–15.0)
WBC: 4 10*3/uL (ref 3.8–10.8)

## 2023-06-29 LAB — RPR: RPR Ser Ql: NONREACTIVE

## 2023-06-29 LAB — LIPID PANEL
Cholesterol: 179 mg/dL (ref <200)
HDL: 49 mg/dL (ref >=40)
LDL Cholesterol (Calc): 113 mg/dL — ABNORMAL HIGH
Non-HDL Cholesterol (Calc): 130 mg/dL — ABNORMAL HIGH (ref <130)
Total CHOL/HDL Ratio: 3.7 (calc) (ref <5.0)
Triglycerides: 76 mg/dL (ref <150)

## 2023-06-29 LAB — COMPREHENSIVE METABOLIC PANEL WITH GFR
AG Ratio: 1.4 (calc) (ref 1.0–2.5)
ALT: 18 U/L (ref 9–46)
AST: 18 U/L (ref 10–40)
Albumin: 4.3 g/dL (ref 3.6–5.1)
Alkaline phosphatase (APISO): 57 U/L (ref 36–130)
BUN: 12 mg/dL (ref 7–25)
CO2: 27 mmol/L (ref 20–32)
Calcium: 9.4 mg/dL (ref 8.6–10.3)
Chloride: 102 mmol/L (ref 98–110)
Creat: 1.13 mg/dL (ref 0.60–1.26)
Globulin: 3.1 g/dL (ref 1.9–3.7)
Glucose, Bld: 83 mg/dL (ref 65–99)
Potassium: 4.3 mmol/L (ref 3.5–5.3)
Sodium: 138 mmol/L (ref 135–146)
Total Bilirubin: 0.4 mg/dL (ref 0.2–1.2)
Total Protein: 7.4 g/dL (ref 6.1–8.1)
eGFR: 86 mL/min/{1.73_m2} (ref >=60)

## 2023-06-29 LAB — HEMOGLOBIN A1C
Hgb A1c MFr Bld: 5.9 %{Hb} — ABNORMAL HIGH (ref <5.7)
Mean Plasma Glucose: 123 mg/dL
eAG (mmol/L): 6.8 mmol/L

## 2023-06-29 LAB — HEPATITIS C ANTIBODY: Hepatitis C Ab: NONREACTIVE

## 2023-06-29 LAB — HIV ANTIBODY (ROUTINE TESTING W REFLEX): HIV 1&2 Ab, 4th Generation: NONREACTIVE

## 2023-07-02 ENCOUNTER — Encounter: Payer: Self-pay | Admitting: Internal Medicine

## 2023-11-21 ENCOUNTER — Encounter: Payer: Self-pay | Admitting: Internal Medicine

## 2023-11-21 ENCOUNTER — Ambulatory Visit: Admitting: Internal Medicine

## 2023-11-21 VITALS — BP 138/84 | Ht 74.0 in | Wt 311.4 lb

## 2023-11-21 DIAGNOSIS — B372 Candidiasis of skin and nail: Secondary | ICD-10-CM | POA: Diagnosis not present

## 2023-11-21 MED ORDER — NYSTATIN 100000 UNIT/GM EX POWD: 1.0000 | Freq: Three times a day (TID) | CUTANEOUS | 0 refills | Status: DC

## 2023-11-21 MED ORDER — FLUCONAZOLE 100 MG PO TABS: 100.0000 mg | ORAL_TABLET | Freq: Every day | ORAL | 0 refills | Status: DC

## 2023-11-21 NOTE — Progress Notes (Signed)
 Subjective:    Patient ID: Jamie Ortiz, male    DOB: Sep 30, 1985, 38 y.o.   MRN: [id number]  HPI  Discussed the use of AI scribe software for clinical note transcription with the patient, who gave verbal consent to proceed.  Jamie Ortiz is a 38 year old male who presents with persistent anal leakage.  He has been experiencing persistent anal leakage, described as a clear discharge or wetness, since at least March. This issue began after treatment for possible hemorrhoids. The leakage is constant, with occasional moments of dryness. He manages the wetness dabbing the area with a washcloth.  He recalls blood in his stool only when wiping too hard, which he attributes to a possible hemorrhoid. He believes excessive wiping may have irritated the initial hemorrhoid, preventing proper healing. Despite dietary changes, including reducing sugar and soda intake and increasing water consumption, he has seen only slight improvement.  No belly pain, significant constipation, or diarrhea. He is not aware of any family history of colon cancer.     Review of Systems     Past Medical History:  Diagnosis Date   Morbid obesity (HCC)     Current Outpatient Medications  Medication Sig Dispense Refill   clotrimazole -betamethasone  (LOTRISONE ) cream Apply topically 2 (two) times daily. 30 g 1   Multiple Vitamins-Minerals (MENS MULTI VITAMIN & MINERAL PO) Take by mouth.     No current facility-administered medications for this visit.    No Known Allergies  Family History  Problem Relation Age of Onset   Hypertension Mother    Cancer Father        lung, prostate   Healthy Sister     Social History   Socioeconomic History   Marital status: Single    Spouse name: Not on file   Number of children: Not on file   Years of education: Not on file   Highest education level: High school graduate  Occupational History   Not on file  Tobacco Use   Smoking status: Never   Smokeless tobacco:  Never  Vaping Use   Vaping status: Never Used  Substance and Sexual Activity   Alcohol use: No   Drug use: No   Sexual activity: Yes    Birth control/protection: None  Other Topics Concern   Not on file  Social History Narrative   Not on file   Social Drivers of Health   Financial Resource Strain: Not on file  Food Insecurity: Not on file  Transportation Needs: Not on file  Physical Activity: Not on file  Stress: Not on file (02/04/2023)  Social Connections: Not on file  Intimate Partner Violence: Low Risk  (07/14/2019)   Received from [facility]   Intimate Partner Violence    Insults You: Not on file    Threatens You: Not on file    Screams at You: Not on file    Physically Hurt: Not on file    Intimate Partner Violence Score: Not on file     Constitutional: Denies fever, malaise, fatigue, headache or abrupt weight changes.  Respiratory: Denies difficulty breathing, shortness of breath, cough or sputum production.   Cardiovascular: Denies chest pain, chest tightness, palpitations or swelling in the hands or feet.  Gastrointestinal: Patient reports anal leakage, intermittent constipation.  Denies abdominal pain, bloating, diarrhea.  Neurological: Denies dizziness, difficulty with memory, difficulty with speech or problems with balance and coordination.    No other specific complaints in a complete review of systems (except  as listed in HPI above).  Objective:   Physical Exam  BP 138/84 (BP Location: Left Arm, Patient Position: Sitting, Cuff Size: Large)   Ht 6' 2 (1.88 m)   Wt (!) 311 lb 6.4 oz (141.3 kg)   BMI 39.98 kg/m     Wt Readings from Last 3 Encounters:  04/10/23 (!) 315 lb 9.6 oz (143.2 kg)  11/17/22 (!) 306 lb (138.8 kg)  06/22/22 (!) 309 lb 3.2 oz (140.3 kg)    General: Appears his stated age, obese, in NAD. Skin: Area of hypopigmentation noted around the anus extending into the perineal area.  Skin is  macerated. Cardiovascular: Normal rate and rhythm.  Pulmonary/Chest: Normal effort and positive vesicular breath sounds. No respiratory distress. No wheezes, rales or ronchi noted.  Abdomen: Soft and nontender. Normal bowel sounds.  Rectal: No external mass noted.  Normal rectal tone.  Hemoccult negative. Neurological: Alert and oriented.    BMET    Component Value Date/Time   NA 138 11/04/2020 1119   NA 141 05/31/2018 1142   K 4.1 11/04/2020 1119   CL 104 11/04/2020 1119   CO2 27 11/04/2020 1119   GLUCOSE 85 11/04/2020 1119   BUN 14 11/04/2020 1119   BUN 10 05/31/2018 1142   CREATININE 1.07 11/04/2020 1119   CALCIUM 9.2 11/04/2020 1119   GFRNONAA 100 03/01/2020 1037   GFRAA 116 03/01/2020 1037    Lipid Panel     Component Value Date/Time   CHOL 190 11/04/2020 1119   CHOL 177 05/31/2018 1142   TRIG 140 11/04/2020 1119   HDL 47 11/04/2020 1119   HDL 42 05/31/2018 1142   CHOLHDL 4.0 11/04/2020 1119   LDLCALC 118 (H) 11/04/2020 1119    CBC    Component Value Date/Time   WBC 3.4 (L) 11/04/2020 1119   RBC 5.01 11/04/2020 1119   HGB 13.8 11/04/2020 1119   HGB 14.3 05/31/2018 1142   HCT 43.3 11/04/2020 1119   HCT 43.5 05/31/2018 1142   PLT 229 11/04/2020 1119   PLT 255 05/31/2018 1142   MCV 86.4 11/04/2020 1119   MCV 85 05/31/2018 1142   MCH 27.5 11/04/2020 1119   MCHC 31.9 (L) 11/04/2020 1119   RDW 13.6 11/04/2020 1119   RDW 13.4 05/31/2018 1142   LYMPHSABS 2,021 06/01/2020 0852   LYMPHSABS 1.1 05/31/2018 1142   EOSABS 212 06/01/2020 0852   EOSABS 0.1 05/31/2018 1142   BASOSABS 41 06/01/2020 0852   BASOSABS 0.0 05/31/2018 1142    Hgb A1C Lab Results  Component Value Date   HGBA1C 5.5 11/04/2020           Assessment & Plan:   Assessment and Plan    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 Examination confirmed  candidal intertrigo, not hemorrhoids. Chronic due to difficulty in keeping area dry. - Prescribed Diflucan  100 mg orally once daily for 10 days. - Provide nystatin  powder for application 2-3 times daily after oral medication. - Advise blotting area dry post-shower or using a hairdryer. - Recommend frequent changing of boxers, use cotton to absorb moisture. - Refer to dermatology for further evaluation. - Educated on avoiding excessive wiping, use wet wipes followed by dry toilet paper.      RTC in 1 month, follow-up chronic conditions Angeline Laura, NP

## 2023-11-21 NOTE — Patient Instructions (Signed)
 Intertrigo Intertrigo is skin irritation (inflammation) that happens in warm, moist areas of the body. The irritation can cause a rash and make skin raw and itchy. The rash is usually pink or red. It happens mostly between folds of skin or where skin rubs together, such as: In the armpits. Under the breasts. Under the belly. In the groin area. Around the butt area. Between the toes. This condition is not passed from person to person. What are the causes? Heat, moisture, rubbing, and not enough air movement. The condition can be made worse by: Sweat. Bacteria. A fungus, such as yeast. What increases the risk? Moisture in your skin folds. You are more likely to develop this condition if you: Are not able to move around. Live in a warm and moist climate. Are not able to control your pee (urine) or poop (stool). Wear splints, braces, or other medical devices. Are overweight. Have diabetes. What are the signs or symptoms? A pink or red skin rash in a skin fold or near a skin fold. Raw or scaly skin. Itching. A burning feeling. Bleeding. Leaking fluid. A bad smell. How is this treated? Cleaning and drying your skin. Taking an antibiotic medicine or using an antibiotic skin cream for a bacterial infection. Using an antifungal cream on your skin or taking pills for an infection that was caused by a fungus, such as yeast. Using a steroid ointment to stop the itching and irritation. Separating the skin fold with a clean cotton cloth to absorb moisture and allow air to flow into the area. Follow these instructions at home: Keep the affected area clean and dry. Do not scratch your skin. Stay cool as much as you can. Use an air conditioner or a fan, if you have one. Apply over-the-counter and prescription medicines only as told by your doctor. If you were prescribed antibiotics, use them as told by your doctor. Do not stop using the antibiotic even if you start to feel better. Keep all  follow-up visits. Your doctor may need to check your skin to make sure that the treatment is working. How is this prevented? Shower and dry yourself well after being active. Use a hair dryer on a cool setting to dry between skin folds. Do not wear tight clothes. Wear clothes that: Are loose. Take moisture away from your body. Are made of cotton. Wear a bra that gives good support, if needed. Protect the skin in your groin and butt area as told by your doctor. To do this: Follow a regular cleaning routine. Use creams, powders, or ointments that protect your skin. Change protection pads often. Stay at a healthy weight. Take care of your feet. This is very important if you have diabetes. You should: Wear shoes that fit well. Keep your feet dry. Wear clean cotton or wool socks. Keep your blood sugar under control if you have diabetes. Contact a doctor if: Your symptoms do not get better with treatment. Your symptoms get worse or they spread. You notice more redness and warmth. You have a fever. This information is not intended to replace advice given to you by your health care provider. Make sure you discuss any questions you have with your health care provider. Document Revised: 08/11/2021 Document Reviewed: 08/11/2021 Elsevier Patient Education  2024 [employer].

## 2024-01-02 ENCOUNTER — Ambulatory Visit: Payer: Self-pay | Admitting: Internal Medicine

## 2024-01-02 ENCOUNTER — Encounter: Payer: Self-pay | Admitting: Internal Medicine

## 2024-01-02 VITALS — BP 132/88 | Ht 74.0 in | Wt 311.8 lb

## 2024-01-02 DIAGNOSIS — E782 Mixed hyperlipidemia: Secondary | ICD-10-CM | POA: Diagnosis not present

## 2024-01-02 DIAGNOSIS — E66813 Obesity, class 3: Secondary | ICD-10-CM

## 2024-01-02 DIAGNOSIS — D700 Congenital agranulocytosis: Secondary | ICD-10-CM

## 2024-01-02 DIAGNOSIS — R7303 Prediabetes: Secondary | ICD-10-CM

## 2024-01-02 DIAGNOSIS — Z6841 Body Mass Index (BMI) 40.0 and over, adult: Secondary | ICD-10-CM

## 2024-01-02 MED ORDER — NYSTATIN 100000 UNIT/GM EX POWD: 1.0000 | Freq: Three times a day (TID) | CUTANEOUS | 0 refills | Status: AC

## 2024-01-02 NOTE — Assessment & Plan Note (Signed)
 CBC today.

## 2024-01-02 NOTE — Assessment & Plan Note (Signed)
 Encourage diet and exercise for weight loss

## 2024-01-02 NOTE — Assessment & Plan Note (Signed)
 Complicated by morbid obesity C-Met and lipid profile today Encouraged low-fat diet

## 2024-01-02 NOTE — Progress Notes (Signed)
 Subjective:    Patient ID: Jamie Ortiz, male    DOB: March 21, 1986, 38 y.o.   MRN: [id number]  HPI  Patient presents the clinic today for follow-up chronic conditions.  Neutropenia: Congenital.  His last WBC was 4% 06/2023.  He is not following with hematology.  Prediabetes: His last A1c was 5.9%, 06/2023.  He does not check his sugars.  He is not taking any oral diabetic medication at this time.  HLD: His last LDL was 113, triglycerides 76, 06/2023.  He is not taking any cholesterol-lowering medication at this time.  He tries to consume a low-fat diet.   Review of Systems     Past Medical History:  Diagnosis Date   Morbid obesity (HCC)     Current Outpatient Medications  Medication Sig Dispense Refill   fluconazole  (DIFLUCAN ) 100 MG tablet Take 1 tablet (100 mg total) by mouth daily. 10 tablet 0   Multiple Vitamins-Minerals (MENS MULTI VITAMIN & MINERAL PO) Take by mouth.     nystatin  (MYCOSTATIN /NYSTOP ) powder Apply 1 Application topically 3 (three) times daily. 60 g 0   No current facility-administered medications for this visit.    No Known Allergies  Family History  Problem Relation Age of Onset   Hypertension Mother    Cancer Father        lung, prostate   Healthy Sister     Social History   Socioeconomic History   Marital status: Single    Spouse name: Not on file   Number of children: Not on file   Years of education: Not on file   Highest education level: High school graduate  Occupational History   Not on file  Tobacco Use   Smoking status: Never   Smokeless tobacco: Never  Vaping Use   Vaping status: Never Used  Substance and Sexual Activity   Alcohol use: No   Drug use: No   Sexual activity: Yes    Birth control/protection: None  Other Topics Concern   Not on file  Social History Narrative   Not on file   Social Drivers of Health   Financial Resource Strain: Not on file  Food Insecurity: Not on file  Transportation Needs: Not on file   Physical Activity: Not on file  Stress: Not on file (02/04/2023)  Social Connections: Not on file  Intimate Partner Violence: Low Risk  (07/14/2019)   Received from [facility]   Intimate Partner Violence    Insults You: Not on file    Threatens You: Not on file    Screams at You: Not on file    Physically Hurt: Not on file    Intimate Partner Violence Score: Not on file     Constitutional: Denies fever, malaise, fatigue, headache or abrupt weight changes.  HEENT: Denies eye pain, eye redness, ear pain, ringing in the ears, wax buildup, runny nose, nasal congestion, bloody nose, or sore throat. Respiratory: Denies difficulty breathing, shortness of breath, cough or sputum production.   Cardiovascular: Denies chest pain, chest tightness, palpitations or swelling in the hands or feet.  Gastrointestinal: Denies abdominal pain, bloating, constipation, diarrhea or blood in the stool.  GU: Denies urgency, frequency, pain with urination, burning sensation, blood in urine, odor or discharge. Musculoskeletal: Denies decrease in range of motion, difficulty with gait, muscle pain or joint pain and swelling.  Skin: Denies redness, rashes, lesions or ulcercations.  Neurological: Denies dizziness, difficulty with memory, difficulty with speech or problems with balance and coordination.  Psych: Denies  anxiety, depression, SI/HI.  No other specific complaints in a complete review of systems (except as listed in HPI above).  Objective:   Physical Exam   BP 132/88 (BP Location: Left Arm, Patient Position: Sitting, Cuff Size: Large)   Ht 6' 2 (1.88 m)   Wt (!) 311 lb 12.8 oz (141.4 kg)   BMI 40.03 kg/m   Wt Readings from Last 3 Encounters:  11/21/23 (!) 311 lb 6.4 oz (141.3 kg)  06/28/23 (!) 317 lb (143.8 kg)  04/10/23 (!) 315 lb 9.6 oz (143.2 kg)    General: Appears his stated age, obese, in NAD. Skin: Warm, dry and intact. No rashes noted. HEENT: Head: normal shape and size; Eyes:  sclera white and EOMs intact;  Cardiovascular: Normal rate and rhythm. S1,S2 noted.  No murmur, rubs or gallops noted. No JVD or BLE edema.  Pulmonary/Chest: Normal effort and positive vesicular breath sounds. No respiratory distress. No wheezes, rales or ronchi noted.  Musculoskeletal: No difficulty with gait.  Neurological: Alert and oriented. Coordination normal.     BMET    Component Value Date/Time   NA 138 06/28/2023 0946   NA 141 05/31/2018 1142   K 4.3 06/28/2023 0946   CL 102 06/28/2023 0946   CO2 27 06/28/2023 0946   GLUCOSE 83 06/28/2023 0946   BUN 12 06/28/2023 0946   BUN 10 05/31/2018 1142   CREATININE 1.13 06/28/2023 0946   CALCIUM 9.4 06/28/2023 0946   GFRNONAA 100 03/01/2020 1037   GFRAA 116 03/01/2020 1037    Lipid Panel     Component Value Date/Time   CHOL 179 06/28/2023 0946   CHOL 177 05/31/2018 1142   TRIG 76 06/28/2023 0946   HDL 49 06/28/2023 0946   HDL 42 05/31/2018 1142   CHOLHDL 3.7 06/28/2023 0946   LDLCALC 113 (H) 06/28/2023 0946    CBC    Component Value Date/Time   WBC 4.0 06/28/2023 0946   RBC 4.89 06/28/2023 0946   HGB 13.6 06/28/2023 0946   HGB 14.3 05/31/2018 1142   HCT 41.5 06/28/2023 0946   HCT 43.5 05/31/2018 1142   PLT 281 06/28/2023 0946   PLT 255 05/31/2018 1142   MCV 84.9 06/28/2023 0946   MCV 85 05/31/2018 1142   MCH 27.8 06/28/2023 0946   MCHC 32.8 06/28/2023 0946   RDW 13.6 06/28/2023 0946   RDW 13.4 05/31/2018 1142   LYMPHSABS 2,021 06/01/2020 0852   LYMPHSABS 1.1 05/31/2018 1142   EOSABS 212 06/01/2020 0852   EOSABS 0.1 05/31/2018 1142   BASOSABS 41 06/01/2020 0852   BASOSABS 0.0 05/31/2018 1142    Hgb A1C Lab Results  Component Value Date   HGBA1C 5.9 (H) 06/28/2023           Assessment & Plan:    RTC in in 6 months, for your annual exam Angeline Laura, NP

## 2024-01-02 NOTE — Assessment & Plan Note (Signed)
 Complicated by morbid obesity A1c today Encourage low-carb diet and exercise for weight loss

## 2024-01-02 NOTE — Patient Instructions (Signed)

## 2024-01-03 ENCOUNTER — Ambulatory Visit: Payer: Self-pay | Admitting: Internal Medicine

## 2024-01-03 LAB — COMPREHENSIVE METABOLIC PANEL WITH GFR
AG Ratio: 1.3 (calc) (ref 1.0–2.5)
ALT: 18 U/L (ref 9–46)
AST: 17 U/L (ref 10–40)
Albumin: 4.3 g/dL (ref 3.6–5.1)
Alkaline phosphatase (APISO): 61 U/L (ref 36–130)
BUN: 10 mg/dL (ref 7–25)
CO2: 28 mmol/L (ref 20–32)
Calcium: 9.4 mg/dL (ref 8.6–10.3)
Chloride: 104 mmol/L (ref 98–110)
Creat: 1.02 mg/dL (ref 0.60–1.26)
Globulin: 3.2 g/dL (ref 1.9–3.7)
Glucose, Bld: 87 mg/dL (ref 65–99)
Potassium: 4.2 mmol/L (ref 3.5–5.3)
Sodium: 138 mmol/L (ref 135–146)
Total Bilirubin: 0.4 mg/dL (ref 0.2–1.2)
Total Protein: 7.5 g/dL (ref 6.1–8.1)
eGFR: 98 mL/min/1.73m2 (ref >=60)

## 2024-01-03 LAB — CBC
HCT: 44.5 % (ref 38.5–50.0)
Hemoglobin: 14.3 g/dL (ref 13.2–17.1)
MCH: 27.3 pg (ref 27.0–33.0)
MCHC: 32.1 g/dL (ref 32.0–36.0)
MCV: 85.1 fL (ref 80.0–100.0)
MPV: 10.4 fL (ref 7.5–12.5)
Platelets: 294 Thousand/uL (ref 140–400)
RBC: 5.23 Million/uL (ref 4.20–5.80)
RDW: 13.9 % (ref 11.0–15.0)
WBC: 3.5 Thousand/uL — ABNORMAL LOW (ref 3.8–10.8)

## 2024-01-03 LAB — LIPID PANEL
Cholesterol: 207 mg/dL — ABNORMAL HIGH (ref <200)
HDL: 53 mg/dL (ref >=40)
LDL Cholesterol (Calc): 136 mg/dL — ABNORMAL HIGH
Non-HDL Cholesterol (Calc): 154 mg/dL — ABNORMAL HIGH (ref <130)
Total CHOL/HDL Ratio: 3.9 (calc) (ref <5.0)
Triglycerides: 79 mg/dL (ref <150)

## 2024-01-03 LAB — HEMOGLOBIN A1C
Hgb A1c MFr Bld: 5.7 % — ABNORMAL HIGH (ref <5.7)
Mean Plasma Glucose: 117 mg/dL
eAG (mmol/L): 6.5 mmol/L

## 2024-03-31 ENCOUNTER — Ambulatory Visit: Payer: Self-pay

## 2024-03-31 NOTE — Telephone Encounter (Signed)
 FYI Only or Action Required?: FYI only for provider: appointment scheduled on 04/04/24.  Patient was last seen in primary care on 01/02/2024 by Antonette Angeline ORN, NP.  Called Nurse Triage reporting Arm Pain.  Symptoms began 1-2 months ago.  Interventions attempted: OTC medications: Tylenol and ibuprofen.  Symptoms are: stable.  Triage Disposition: See Within 2 Weeks in Office (overriding See PCP When Office is Open (Within 3 Days))  Patient/caregiver understands and will follow disposition?: Yes                                  1. ONSET: When did the pain start?     1-2 months ago, states pain has stayed consistent 2. LOCATION: Where is the pain located?     Left upper arm  3. PAIN: How bad is the pain? (Scale 0-10; or none, mild, moderate, severe)     Rates pain about a 6 4. WORK OR EXERCISE: Has there been any recent work or exercise that involved this part of the body?     Denies 5. CAUSE: What do you think is causing the arm pain?     Believes he slept on it wrong 6. OTHER SYMPTOMS: Do you have any other symptoms? (e.g., neck pain, swelling, rash, fever, numbness, weakness)     Reports full ROM in left arm and neck Denies redness, denies swelling, denies fever, denies numbess/weakness, denies chest pain, denies jaw pain, denies difficulty breathing  Copied from CRM #[id number]. Topic: Clinical - Red Word Triage >> Mar 31, 2024 11:57 AM Avram MATSU wrote: Red Word that prompted transfer to Nurse Triage: pain in left arm/shoulder that not getting better. Not sure what happened.  Reason for Disposition  [1] MODERATE pain (e.g., interferes with normal activities) AND [2] present > 3 days  Protocols used: Arm Pain-A-AH

## 2024-03-31 NOTE — Telephone Encounter (Signed)
"  Will discuss at upcoming appointment  "

## 2024-04-03 NOTE — Progress Notes (Unsigned)
 "  Subjective:    Patient ID: Jamie Ortiz, male    DOB: July 22, 1985, 39 y.o.   MRN: [id number]  HPI  Patient presents to clinic today for his annual exam.  Flu: never Tetanus: 12/2016 COVID: never Dentist: biannually  Diet: He does eat meat. He consumes fruits and veggies. He does eat some fried foods. He drinks mostly water, sweet tea. Exercise: Walking/Running  Review of Systems     Past Medical History:  Diagnosis Date   Morbid obesity (HCC)     Current Outpatient Medications  Medication Sig Dispense Refill   clotrimazole -betamethasone  (LOTRISONE ) cream Apply topically 2 (two) times daily. 30 g 1   Multiple Vitamins-Minerals (MENS MULTI VITAMIN & MINERAL PO) Take by mouth.     No current facility-administered medications for this visit.    No Known Allergies  Family History  Problem Relation Age of Onset   Hypertension Mother    Cancer Father        lung, prostate   Healthy Sister     Social History   Socioeconomic History   Marital status: Single    Spouse name: Not on file   Number of children: Not on file   Years of education: Not on file   Highest education level: High school graduate  Occupational History   Not on file  Tobacco Use   Smoking status: Never   Smokeless tobacco: Never  Vaping Use   Vaping status: Never Used  Substance and Sexual Activity   Alcohol use: No   Drug use: No   Sexual activity: Yes    Birth control/protection: None  Other Topics Concern   Not on file  Social History Narrative   Not on file   Social Drivers of Health   Financial Resource Strain: Not on file  Food Insecurity: Not on file  Transportation Needs: Not on file  Physical Activity: Not on file  Stress: Not on file (02/04/2023)  Social Connections: Not on file  Intimate Partner Violence: Low Risk  (07/14/2019)   Received from [facility]   Intimate Partner Violence    Insults You: Not on file    Threatens You: Not on file    Screams  at You: Not on file    Physically Hurt: Not on file    Intimate Partner Violence Score: Not on file     Constitutional: Denies fever, malaise, fatigue, headache or abrupt weight changes.  HEENT: Denies eye pain, eye redness, ear pain, ringing in the ears, wax buildup, runny nose, nasal congestion, bloody nose, or sore throat. Respiratory: Denies difficulty breathing, shortness of breath, cough or sputum production.   Cardiovascular: Denies chest pain, chest tightness, palpitations or swelling in the hands or feet.  Gastrointestinal:  Denies abdominal pain, bloating, constipation, diarrhea or blood in stool.  GU: Denies urgency, frequency, pain with urination, burning sensation, blood in urine, odor or discharge. Musculoskeletal: Pt reports left arm pain. Denies decrease in range of motion, difficulty with gait, or joint pain and swelling.  Skin:  Denies redness, rashes, lesions or ulcercations.  Neurological: Denies dizziness, difficulty with memory, difficulty with speech or problems with balance and coordination.  Psych: Denies anxiety, depression, SI/HI.  No other specific complaints in a complete review of systems (except as listed in HPI above).  Objective:   Physical Exam   There were no vitals taken for this visit.   Wt Readings from Last 3 Encounters:  04/10/23 (!) 315 lb 9.6 oz (143.2 kg)  11/17/22 (!) 306 lb (138.8 kg)  06/22/22 (!) 309 lb 3.2 oz (140.3 kg)    General: Appears his stated age, obese, in NAD. Skin: Warm, dry and intact.  Eczema noted behind right ear and along left side of scalp line at the forehead. HEENT: Head: normal shape and size; Eyes: sclera white, no icterus, conjunctiva pink, PERRLA and EOMs intact;  Neck:  Neck supple, trachea midline. No masses, lumps or thyromegaly present.  Cardiovascular: Normal rate and rhythm. S1,S2 noted.  No murmur, rubs or gallops noted. No JVD or BLE edema.  Pulmonary/Chest: Normal effort and positive vesicular breath  sounds. No respiratory distress. No wheezes, rales or ronchi noted.  Abdomen: Soft and nontender. Normal bowel sounds.  Musculoskeletal: Strength 5/5 BUE/BLE.  No difficulty with gait.  Neurological: Alert and oriented. Cranial nerves II-XII grossly intact. Coordination normal.  Psychiatric: Mood and affect normal. Behavior is normal. Judgment and thought content normal.     BMET    Component Value Date/Time   NA 138 11/04/2020 1119   NA 141 05/31/2018 1142   K 4.1 11/04/2020 1119   CL 104 11/04/2020 1119   CO2 27 11/04/2020 1119   GLUCOSE 85 11/04/2020 1119   BUN 14 11/04/2020 1119   BUN 10 05/31/2018 1142   CREATININE 1.07 11/04/2020 1119   CALCIUM 9.2 11/04/2020 1119   GFRNONAA 100 03/01/2020 1037   GFRAA 116 03/01/2020 1037    Lipid Panel     Component Value Date/Time   CHOL 190 11/04/2020 1119   CHOL 177 05/31/2018 1142   TRIG 140 11/04/2020 1119   HDL 47 11/04/2020 1119   HDL 42 05/31/2018 1142   CHOLHDL 4.0 11/04/2020 1119   LDLCALC 118 (H) 11/04/2020 1119    CBC    Component Value Date/Time   WBC 3.4 (L) 11/04/2020 1119   RBC 5.01 11/04/2020 1119   HGB 13.8 11/04/2020 1119   HGB 14.3 05/31/2018 1142   HCT 43.3 11/04/2020 1119   HCT 43.5 05/31/2018 1142   PLT 229 11/04/2020 1119   PLT 255 05/31/2018 1142   MCV 86.4 11/04/2020 1119   MCV 85 05/31/2018 1142   MCH 27.5 11/04/2020 1119   MCHC 31.9 (L) 11/04/2020 1119   RDW 13.6 11/04/2020 1119   RDW 13.4 05/31/2018 1142   LYMPHSABS 2,021 06/01/2020 0852   LYMPHSABS 1.1 05/31/2018 1142   EOSABS 212 06/01/2020 0852   EOSABS 0.1 05/31/2018 1142   BASOSABS 41 06/01/2020 0852   BASOSABS 0.0 05/31/2018 1142    Hgb A1C Lab Results  Component Value Date   HGBA1C 5.5 11/04/2020           Assessment & Plan:     RTC in 3 months for your annual exam Angeline Laura, NP  "

## 2024-04-04 ENCOUNTER — Encounter: Payer: Self-pay | Admitting: Internal Medicine

## 2024-04-04 ENCOUNTER — Ambulatory Visit: Admitting: Internal Medicine

## 2024-04-04 VITALS — BP 138/80 | Ht 74.0 in | Wt 323.0 lb

## 2024-04-04 DIAGNOSIS — R030 Elevated blood-pressure reading, without diagnosis of hypertension: Secondary | ICD-10-CM | POA: Diagnosis not present

## 2024-04-04 DIAGNOSIS — M25511 Pain in right shoulder: Secondary | ICD-10-CM | POA: Diagnosis not present

## 2024-04-04 DIAGNOSIS — G8929 Other chronic pain: Secondary | ICD-10-CM | POA: Diagnosis not present

## 2024-04-04 DIAGNOSIS — M25512 Pain in left shoulder: Secondary | ICD-10-CM | POA: Diagnosis not present

## 2024-04-04 MED ORDER — PREDNISONE 10 MG PO TABS: ORAL_TABLET | ORAL | 0 refills | Status: AC

## 2024-04-04 NOTE — Patient Instructions (Signed)

## 2024-04-04 NOTE — Assessment & Plan Note (Signed)
 Encouraged diet and exercise for weight loss ?

## 2024-06-30 ENCOUNTER — Encounter: Admitting: Internal Medicine
